# Patient Record
Sex: Female | Born: 1945 | Race: White | Hispanic: No | Marital: Single | Smoking: Current some day smoker
Health system: Southern US, Community
[De-identification: ages and names within clinical notes are randomized; demographics above are authoritative.]

## PROBLEM LIST (undated history)

## (undated) DIAGNOSIS — F32A Depression, unspecified: Secondary | ICD-10-CM

## (undated) DIAGNOSIS — G2581 Restless legs syndrome: Secondary | ICD-10-CM

## (undated) DIAGNOSIS — I10 Essential (primary) hypertension: Secondary | ICD-10-CM

## (undated) DIAGNOSIS — F329 Major depressive disorder, single episode, unspecified: Secondary | ICD-10-CM

## (undated) DIAGNOSIS — K219 Gastro-esophageal reflux disease without esophagitis: Secondary | ICD-10-CM

## (undated) DIAGNOSIS — G20 Parkinson's disease: Secondary | ICD-10-CM

## (undated) DIAGNOSIS — F419 Anxiety disorder, unspecified: Secondary | ICD-10-CM

## (undated) DIAGNOSIS — E039 Hypothyroidism, unspecified: Secondary | ICD-10-CM

## (undated) DIAGNOSIS — IMO0002 Reserved for concepts with insufficient information to code with codable children: Secondary | ICD-10-CM

## (undated) DIAGNOSIS — E119 Type 2 diabetes mellitus without complications: Secondary | ICD-10-CM

## (undated) DIAGNOSIS — R251 Tremor, unspecified: Principal | ICD-10-CM

## (undated) DIAGNOSIS — T8859XA Other complications of anesthesia, initial encounter: Secondary | ICD-10-CM

## (undated) HISTORY — DX: Anxiety disorder, unspecified: F41.9

## (undated) HISTORY — DX: Reserved for concepts with insufficient information to code with codable children: IMO0002

## (undated) HISTORY — PX: CHOLECYSTECTOMY: SHX55

## (undated) HISTORY — PX: KNEE SURGERY: SHX244

## (undated) HISTORY — PX: HAND SURGERY: SHX662

## (undated) HISTORY — PX: BREAST REDUCTION SURGERY: SHX8

## (undated) HISTORY — PX: ABDOMINAL HYSTERECTOMY: SHX81

## (undated) HISTORY — DX: Tremor, unspecified: R25.1

## (undated) HISTORY — DX: Major depressive disorder, single episode, unspecified: F32.9

## (undated) HISTORY — PX: CERVICAL SPINE SURGERY: SHX589

## (undated) HISTORY — DX: Type 2 diabetes mellitus without complications: E11.9

## (undated) HISTORY — DX: Parkinson's disease: G20

## (undated) HISTORY — DX: Depression, unspecified: F32.A

## (undated) HISTORY — DX: Gastro-esophageal reflux disease without esophagitis: K21.9

## (undated) HISTORY — DX: Hypothyroidism, unspecified: E03.9

## (undated) HISTORY — DX: Essential (primary) hypertension: I10

## (undated) HISTORY — DX: Restless legs syndrome: G25.81

---

## 1997-09-19 ENCOUNTER — Inpatient Hospital Stay (HOSPITAL_COMMUNITY): Admission: AD | Admit: 1997-09-19 | Discharge: 1997-09-19 | Payer: Self-pay | Admitting: Gynecology

## 1999-01-05 ENCOUNTER — Other Ambulatory Visit: Admission: RE | Admit: 1999-01-05 | Discharge: 1999-01-05 | Payer: Self-pay | Admitting: Gynecology

## 1999-09-28 ENCOUNTER — Encounter: Payer: Self-pay | Admitting: Orthopedic Surgery

## 1999-09-28 ENCOUNTER — Encounter: Admission: RE | Admit: 1999-09-28 | Discharge: 1999-09-28 | Payer: Self-pay | Admitting: Orthopedic Surgery

## 2000-01-12 ENCOUNTER — Other Ambulatory Visit: Admission: RE | Admit: 2000-01-12 | Discharge: 2000-01-12 | Payer: Self-pay | Admitting: Gynecology

## 2000-06-25 ENCOUNTER — Ambulatory Visit (HOSPITAL_BASED_OUTPATIENT_CLINIC_OR_DEPARTMENT_OTHER): Admission: RE | Admit: 2000-06-25 | Discharge: 2000-06-26 | Payer: Self-pay | Admitting: Specialist

## 2000-06-25 ENCOUNTER — Encounter (INDEPENDENT_AMBULATORY_CARE_PROVIDER_SITE_OTHER): Payer: Self-pay | Admitting: Specialist

## 2001-01-11 ENCOUNTER — Other Ambulatory Visit: Admission: RE | Admit: 2001-01-11 | Discharge: 2001-01-11 | Payer: Self-pay | Admitting: Gynecology

## 2001-05-03 ENCOUNTER — Encounter: Payer: Self-pay | Admitting: Internal Medicine

## 2001-05-03 ENCOUNTER — Encounter: Admission: RE | Admit: 2001-05-03 | Discharge: 2001-05-03 | Payer: Self-pay | Admitting: Internal Medicine

## 2002-01-20 ENCOUNTER — Other Ambulatory Visit: Admission: RE | Admit: 2002-01-20 | Discharge: 2002-01-20 | Payer: Self-pay | Admitting: Gynecology

## 2003-01-22 ENCOUNTER — Other Ambulatory Visit: Admission: RE | Admit: 2003-01-22 | Discharge: 2003-01-22 | Payer: Self-pay | Admitting: Gynecology

## 2003-04-19 ENCOUNTER — Encounter: Payer: Self-pay | Admitting: Orthopedic Surgery

## 2003-04-19 ENCOUNTER — Encounter: Admission: RE | Admit: 2003-04-19 | Discharge: 2003-04-19 | Payer: Self-pay | Admitting: Orthopedic Surgery

## 2004-02-09 ENCOUNTER — Other Ambulatory Visit: Admission: RE | Admit: 2004-02-09 | Discharge: 2004-02-09 | Payer: Self-pay | Admitting: Gynecology

## 2005-03-02 ENCOUNTER — Other Ambulatory Visit: Admission: RE | Admit: 2005-03-02 | Discharge: 2005-03-02 | Payer: Self-pay | Admitting: Gynecology

## 2005-08-15 ENCOUNTER — Ambulatory Visit (HOSPITAL_BASED_OUTPATIENT_CLINIC_OR_DEPARTMENT_OTHER): Admission: RE | Admit: 2005-08-15 | Discharge: 2005-08-15 | Payer: Self-pay | Admitting: Orthopedic Surgery

## 2006-02-14 ENCOUNTER — Encounter: Admission: RE | Admit: 2006-02-14 | Discharge: 2006-02-14 | Payer: Self-pay | Admitting: Rheumatology

## 2006-04-19 ENCOUNTER — Encounter: Admission: RE | Admit: 2006-04-19 | Discharge: 2006-04-19 | Payer: Self-pay | Admitting: Orthopaedic Surgery

## 2006-05-29 ENCOUNTER — Encounter: Admission: RE | Admit: 2006-05-29 | Discharge: 2006-05-29 | Payer: Self-pay | Admitting: Orthopaedic Surgery

## 2006-06-12 ENCOUNTER — Other Ambulatory Visit: Admission: RE | Admit: 2006-06-12 | Discharge: 2006-06-12 | Payer: Self-pay | Admitting: Gynecology

## 2006-07-19 ENCOUNTER — Inpatient Hospital Stay (HOSPITAL_COMMUNITY): Admission: RE | Admit: 2006-07-19 | Discharge: 2006-07-23 | Payer: Self-pay | Admitting: Orthopaedic Surgery

## 2006-11-06 ENCOUNTER — Encounter: Admission: RE | Admit: 2006-11-06 | Discharge: 2006-11-06 | Payer: Self-pay | Admitting: Orthopedic Surgery

## 2006-11-06 ENCOUNTER — Encounter: Admission: RE | Admit: 2006-11-06 | Discharge: 2006-11-06 | Payer: Self-pay | Admitting: Orthopaedic Surgery

## 2007-02-06 ENCOUNTER — Inpatient Hospital Stay (HOSPITAL_COMMUNITY): Admission: RE | Admit: 2007-02-06 | Discharge: 2007-02-07 | Payer: Self-pay | Admitting: Orthopaedic Surgery

## 2007-05-02 ENCOUNTER — Encounter: Admission: RE | Admit: 2007-05-02 | Discharge: 2007-05-02 | Payer: Self-pay | Admitting: Orthopedic Surgery

## 2007-08-24 ENCOUNTER — Encounter: Admission: RE | Admit: 2007-08-24 | Discharge: 2007-08-24 | Payer: Self-pay | Admitting: Orthopaedic Surgery

## 2007-10-21 ENCOUNTER — Other Ambulatory Visit: Admission: RE | Admit: 2007-10-21 | Discharge: 2007-10-21 | Payer: Self-pay | Admitting: Gynecology

## 2008-01-01 ENCOUNTER — Encounter: Payer: Self-pay | Admitting: Internal Medicine

## 2008-02-11 ENCOUNTER — Ambulatory Visit: Payer: Self-pay | Admitting: Internal Medicine

## 2008-02-11 DIAGNOSIS — R05 Cough: Secondary | ICD-10-CM

## 2008-02-11 DIAGNOSIS — R059 Cough, unspecified: Secondary | ICD-10-CM

## 2008-02-11 DIAGNOSIS — K219 Gastro-esophageal reflux disease without esophagitis: Secondary | ICD-10-CM

## 2008-02-11 HISTORY — DX: Cough, unspecified: R05.9

## 2008-02-11 HISTORY — DX: Gastro-esophageal reflux disease without esophagitis: K21.9

## 2008-02-16 DIAGNOSIS — E119 Type 2 diabetes mellitus without complications: Secondary | ICD-10-CM

## 2008-02-16 HISTORY — DX: Type 2 diabetes mellitus without complications: E11.9

## 2008-05-18 ENCOUNTER — Telehealth (INDEPENDENT_AMBULATORY_CARE_PROVIDER_SITE_OTHER): Payer: Self-pay | Admitting: *Deleted

## 2008-05-19 ENCOUNTER — Ambulatory Visit: Payer: Self-pay | Admitting: Internal Medicine

## 2008-05-22 ENCOUNTER — Telehealth (INDEPENDENT_AMBULATORY_CARE_PROVIDER_SITE_OTHER): Payer: Self-pay | Admitting: *Deleted

## 2008-05-25 ENCOUNTER — Telehealth (INDEPENDENT_AMBULATORY_CARE_PROVIDER_SITE_OTHER): Payer: Self-pay | Admitting: *Deleted

## 2008-09-20 ENCOUNTER — Encounter: Admission: RE | Admit: 2008-09-20 | Discharge: 2008-09-20 | Payer: Self-pay | Admitting: Orthopaedic Surgery

## 2008-10-12 ENCOUNTER — Ambulatory Visit: Payer: Self-pay | Admitting: Internal Medicine

## 2008-10-12 DIAGNOSIS — R042 Hemoptysis: Secondary | ICD-10-CM

## 2008-10-12 DIAGNOSIS — M5136 Other intervertebral disc degeneration, lumbar region: Secondary | ICD-10-CM

## 2008-10-12 DIAGNOSIS — M51369 Other intervertebral disc degeneration, lumbar region without mention of lumbar back pain or lower extremity pain: Secondary | ICD-10-CM

## 2008-10-12 DIAGNOSIS — IMO0002 Reserved for concepts with insufficient information to code with codable children: Secondary | ICD-10-CM

## 2008-10-12 HISTORY — DX: Reserved for concepts with insufficient information to code with codable children: IMO0002

## 2008-10-12 HISTORY — DX: Other intervertebral disc degeneration, lumbar region without mention of lumbar back pain or lower extremity pain: M51.369

## 2008-10-12 HISTORY — DX: Hemoptysis: R04.2

## 2008-10-12 HISTORY — DX: Other intervertebral disc degeneration, lumbar region: M51.36

## 2008-10-13 ENCOUNTER — Telehealth: Payer: Self-pay | Admitting: Internal Medicine

## 2008-10-16 ENCOUNTER — Telehealth (INDEPENDENT_AMBULATORY_CARE_PROVIDER_SITE_OTHER): Payer: Self-pay | Admitting: *Deleted

## 2010-02-03 IMAGING — MG MAMMOGRAM DIAGNOSTIC UNILATERAL DIGITAL W/ CAD
2 series · 2 of 2 positions shown · non-contrast
Comparison: none

FINDINGS
CLINICAL DATA: LUMBAR HNP.
TWO PORTABLE CROSS TABLE LATERAL VIEWS OF THE LUMBAR SPINE WERE OBTAINED IN THE OPERATING ROOM.
FILM #1 SHOWS A INSTRUMENT POSTERIORLY IN THE L3-4 INTERSPINOUS SPACE.  FILM #2 SHOWS POSTERIOR
RETRACTORS AS WELL AS AN INSTRUMENT OVERLYING THE POSTERIOR ELEMENTS TO THE LEVEL OF L4-5.
IMPRESSION

[L (1 of 2)]
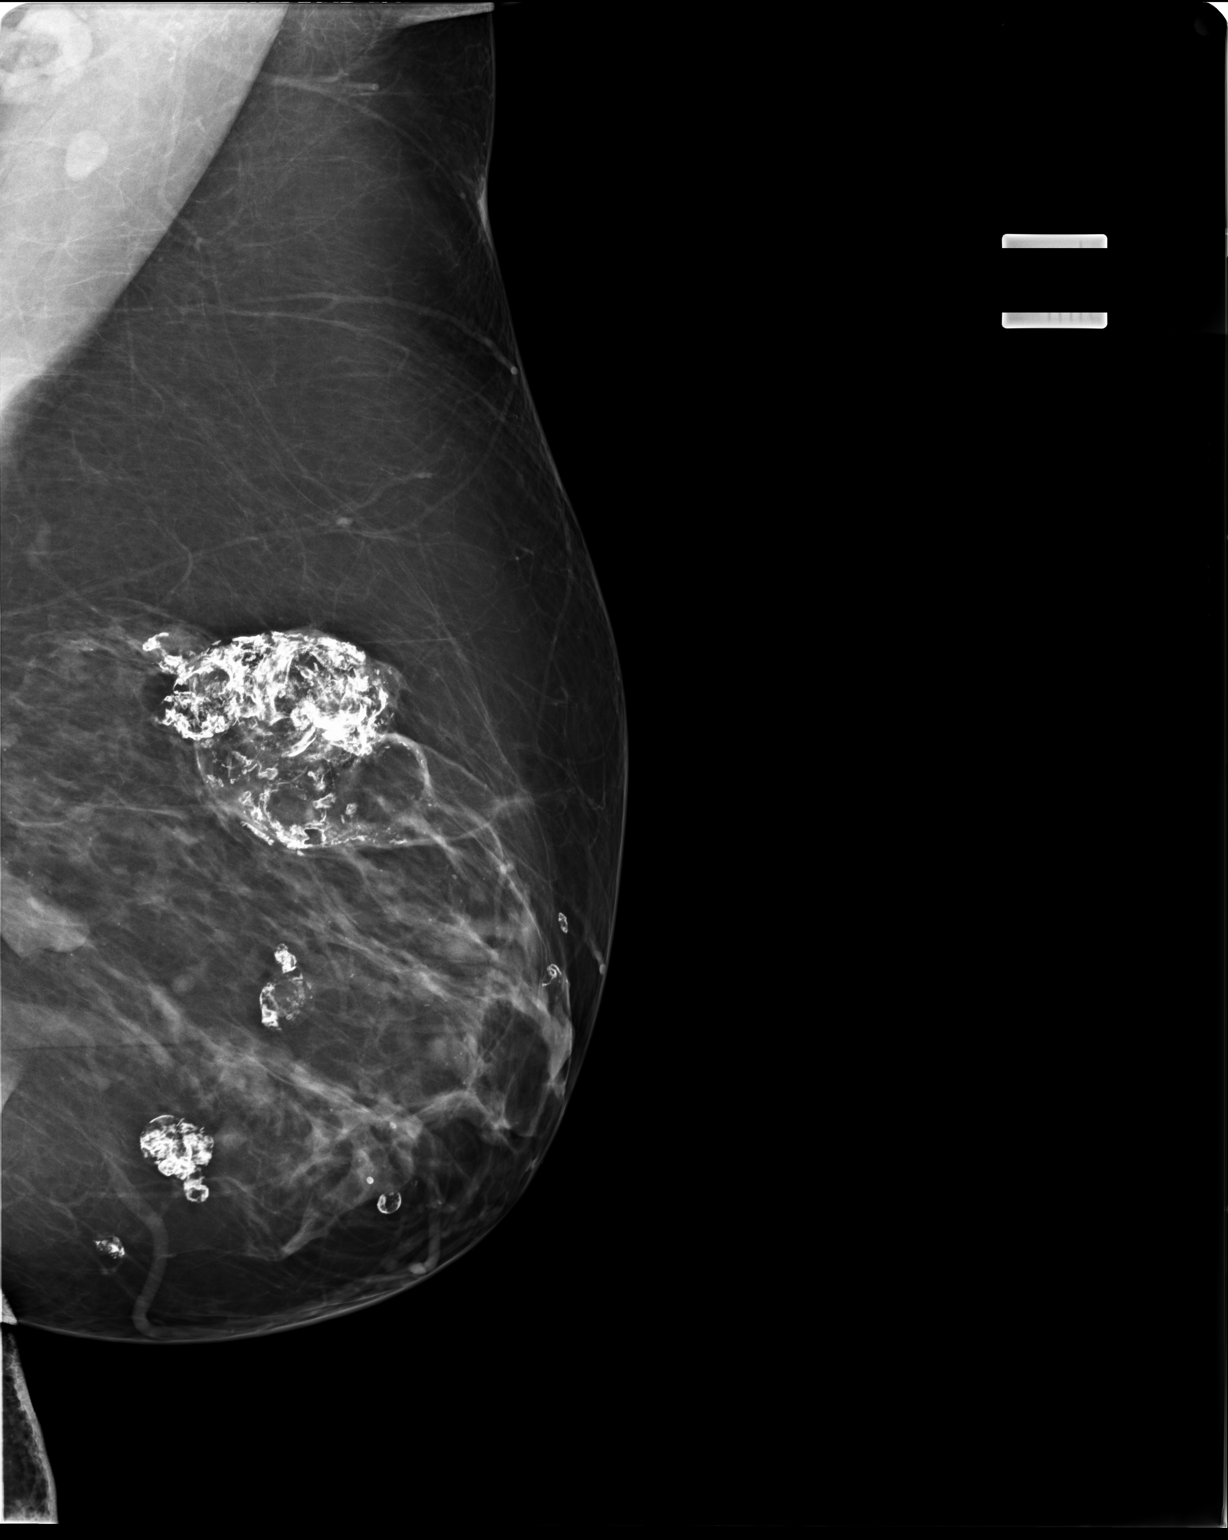

[L (2 of 2)]
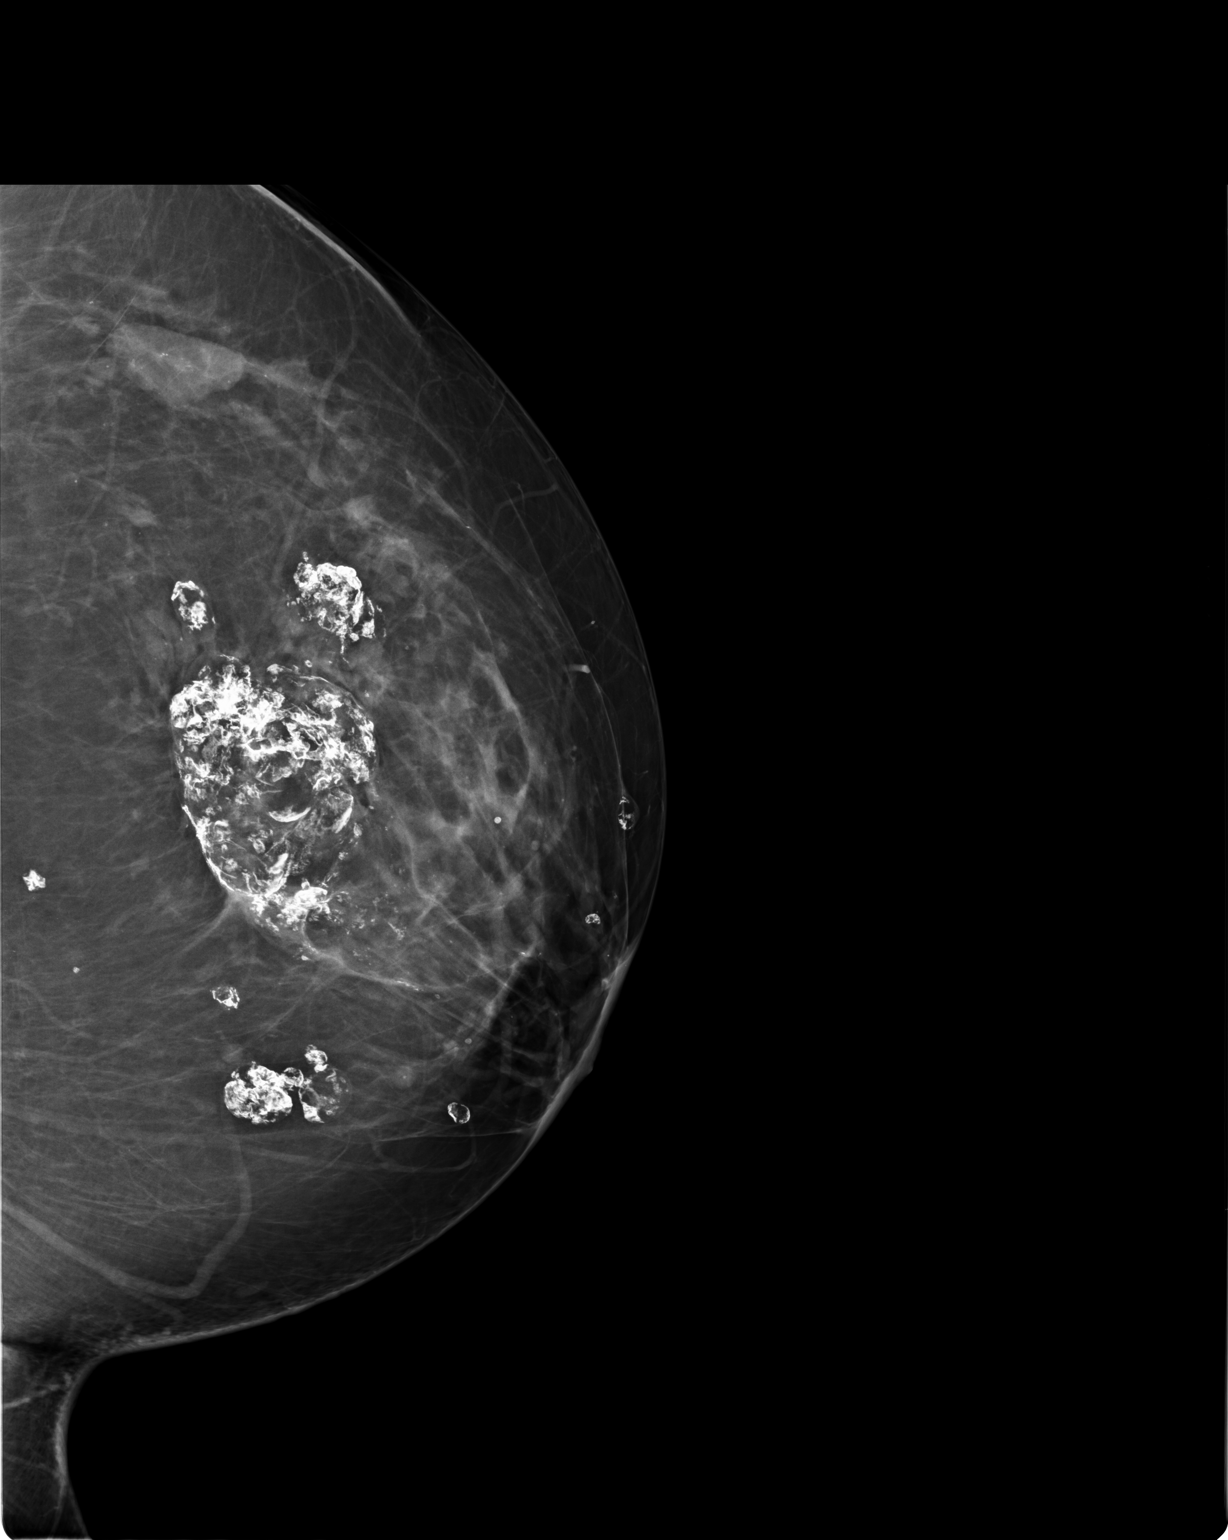

[2 of 2 positions shown; findings below may reference images not displayed]

## 2010-08-22 ENCOUNTER — Encounter: Payer: Self-pay | Admitting: Orthopedic Surgery

## 2010-08-30 NOTE — Progress Notes (Signed)
Summary: results  Phone Note Call from Patient Call back at Home Phone [phone]   Caller: Patient Call For: tammy p Reason for Call: Talk to Nurse, Lab or Test Results Summary of Call: cxr results Initial call taken by: Eugene Gavia,  May 25, 2008 3:36 PM  Follow-up for Phone Call        Pt requesting CXR results from 05-19-08. Please advise. Michel Bickers [facility]  May 25, 2008 4:03 PM  Additional Follow-up for Phone Call Additional follow up Details #1::        cxr ok no changes.  Additional Follow-up by: Rubye Oaks NP,  May 25, 2008 4:35 PM    Additional Follow-up for Phone Call Additional follow up Details #2::    Pt aware of CXR results Follow-up by: Michel Bickers CMA,  May 25, 2008 5:00 PM

## 2010-08-30 NOTE — Assessment & Plan Note (Signed)
Summary: cough/lmr   Referred by:  Shary Decamp PCP:  Shary Decamp  Chief Complaint:  prod ocugh with sticky white sputum, esp when lying down - has been taking protonix bid like CY rec, and but sees no improvement.  History of Present Illness:  65 year old woman with known history of chronic cough since 1990.  02/11/08--Seen in pulmonary consultation at the kind request of Alona Bene, nurse practitioner at Dr. Anders Grant office. complaints of coughing for 15 to 20 years.  Sometimes, made worse by lying down or "everything sometimes.".  This cough will wake her up.  It is not seasonal or whether related.  She has been told she has postnasal drip, but she is not aware of it and notices only mild seasonal rhinitis.  Stress incontinence.  Previous diagnosis of laryngopharyngial  reflux and cyclical cough. In 1990 Dr. Charm Barges diagnosed GERD and peptic ulcer disease, and put her on acid blockers.  Using protonixit b.i.d., old record indicates she got 80 to 90% better. she dropped back to once a day.  Does wake ware of reflux into her throat such that she has to get up. Tussionex has helped some.  She could not tolerate a 24-hour pH probe.  Asthma treatment with metered inhaler did not help. little hoarseness.  Has had pneumonia and occasional sinusitis, but cough is not usually productive.--advised to use PPI two times a day .   May 19, 2008--Presents for flare of cough for last few weeks, productive with thick white mucus, severe fits at times. Review of meds shows she is on ace inhibitor for unkonwn amount of time, not on at last visit. Pt unclear to start date. Denies chest pain, dyspnea, orthopnea, hemoptysis, fever, n/v/d, edema. Has been on antibiotics x 3 without relief.            Prior Medications Reviewed Using: Medication Bottles  Updated Prior Medication List: LEVOTHROID 112 MCG  TABS (LEVOTHYROXINE SODIUM) take 1 by mouth once daily CYMBALTA 60 MG  CPEP (DULOXETINE HCL) take 1 by mouth once  daily CYMBALTA 30 MG  CPEP (DULOXETINE HCL) take 1 by mouth once daily FUROSEMIDE 40 MG  TABS (FUROSEMIDE) take 1 by mouth once daily PANTOPRAZOLE SODIUM 40 MG  TBEC (PANTOPRAZOLE SODIUM) 1 twice daily before meals NITROSTAT 0.4 MG  SUBL (NITROGLYCERIN) as needed GLIPIZIDE XL 2.5 MG  XR24H-TAB (GLIPIZIDE) take 3 by mouth once daily in divided doses METFORMIN HCL 500 MG  TABS (METFORMIN HCL) take 1 by mouth once daily with food RAMIPRIL 2.5 MG CAPS (RAMIPRIL) Take 1 capsule by mouth once a day MELOXICAM 15 MG TABS (MELOXICAM) Take 1 tablet by mouth once a day MIRAPEX 0.5 MG TABS (PRAMIPEXOLE DIHYDROCHLORIDE) up to 3 tabs by mouth daily  Current Allergies (reviewed today): ! PCN ! * TAPE ! MORPHINE ! * LATEX  Past Medical History:    Reviewed history from 02/11/2008 and no changes required:       G E R D (ICD-530.81)       DIABETES, TYPE 2 (ICD-250.00)       COUGH (ICD-786.2)       GASTROESOPHAGEAL REFLUX DISEASE, CHRONIC (ICD-530.81)           Family History:    Reviewed history from 02/11/2008 and no changes required:       Family History Coronary Heart Disease- brother died       mother died age 17  Social History:    Reviewed history from 02/11/2008 and no changes required:  Patient is a current smoker.- 1/2 ppd        disability       separated, living alone with 2 dogs and 2 cats    Risk Factors: Tobacco use:  current   Review of Systems      See HPI   Vital Signs:  Patient Profile:   65 Years Old Female Weight:      187.38 pounds O2 Sat:      98 % O2 treatment:    Room Air Temp:     96.8 degrees F oral Pulse rate:   73 / minute BP sitting:   130 / 74  (left arm) Cuff size:   regular  Vitals Entered By: Boone Master CNA (May 19, 2008 3:10 PM)             Is Patient Diabetic? Yes Comments Medications reviewed with patient Boone Master CNA  May 19, 2008 3:13 PM      Physical Exam  GEN: A/Ox3; pleasant , NAD HEENT:  [place]/AT, ,  EACs-clear, TMs-wnl, NOSE-clear, THROAT-clear NECK:  Supple w/ fair ROM; no JVD; normal carotid impulses w/o bruits; no thyromegaly or nodules palpated; no lymphadenopathy. CHEST:  Coarse BS with psuedowheezing on forced exp.  HEART:  RRR, no m/r/g   ABDOMEN:  Soft & nt; nml bowel sounds; no organomegaly or masses detected. EXT: Warm bil,  no calf pain, edema, clubbing, pulses intact Neuro: EOM-wnl, PERRLA, CN 2-12 intact,nml equal grips/streng       Impression & Recommendations:  Problem # 1:  COUGH (ICD-786.2) Exacerbation of chronic cough, now on ace inhibitor which I suspect is causing flare and preventing improvement.  CXR pending.  REC: Stop Ramipril.  Begin Micardis 20mg  once daily  Delsym 2 tsp every 12 hrs as needed cough Tessalon Perles three times a day as needed still coughing.  Phenergan VC codeine 1 tsp every 4 hr as needed still coughing.  Protonix 40mg  two times a day  Sugarless candy to avoid throat clearing and coughing.  Stop smoking.  Please contact office for sooner follow up if symptoms do not improve or worsen  follow up 2-3 weeks Dr. Maple Hudson  Orders: T-2 View CXR, Same Day (71020.5TC) Est. Patient Level IV (07371)   Medications Added to Medication List This Visit: 1)  Ramipril 2.5 Mg Caps (Ramipril) .... Take 1 capsule by mouth once a day 2)  Micardis 20 Mg Tabs (Telmisartan) .Marland Kitchen.. 1 by mouth once daily 3)  Meloxicam 15 Mg Tabs (Meloxicam) .... Take 1 tablet by mouth once a day 4)  Mirapex 0.5 Mg Tabs (Pramipexole dihydrochloride) .... Up to 3 tabs by mouth daily 5)  Promethazine-codeine 6.25-10 Mg/60ml Syrp (Promethazine-codeine) .Marland Kitchen.. 1 tsp every 4-6 hrs as needed coughing.  Complete Medication List: 1)  Levothroid 112 Mcg Tabs (Levothyroxine sodium) .... Take 1 by mouth once daily 2)  Cymbalta 60 Mg Cpep (Duloxetine hcl) .... Take 1 by mouth once daily 3)  Cymbalta 30 Mg Cpep (Duloxetine hcl) .... Take 1 by mouth once daily 4)  Furosemide 40 Mg Tabs  (Furosemide) .... Take 1 by mouth once daily 5)  Pantoprazole Sodium 40 Mg Tbec (Pantoprazole sodium) .Marland Kitchen.. 1 twice daily before meals 6)  Nitrostat 0.4 Mg Subl (Nitroglycerin) .... As needed 7)  Glipizide Xl 2.5 Mg Xr24h-tab (Glipizide) .... Take 3 by mouth once daily in divided doses 8)  Metformin Hcl 500 Mg Tabs (Metformin hcl) .... Take 1 by mouth once daily with food 9)  Micardis 20  Mg Tabs (Telmisartan) .Marland Kitchen.. 1 by mouth once daily 10)  Meloxicam 15 Mg Tabs (Meloxicam) .... Take 1 tablet by mouth once a day 11)  Mirapex 0.5 Mg Tabs (Pramipexole dihydrochloride) .... Up to 3 tabs by mouth daily 12)  Promethazine-codeine 6.25-10 Mg/58ml Syrp (Promethazine-codeine) .Marland Kitchen.. 1 tsp every 4-6 hrs as needed coughing.   Patient Instructions: 1)  Stop Ramipril.  2)  Begin Micardis 20mg  once daily  3)  Delsym 2 tsp every 12 hrs as needed cough 4)  Tessalon Perles three times a day as needed still coughing.  5)  Phenergan VC codeine 1 tsp every 4 hr as needed still coughing.  6)  Protonix 40mg  two times a day  7)  Sugarless candy to avoid throat clearing and coughing.  8)  Stop smoking.  9)  Please contact office for sooner follow up if symptoms do not improve or worsen  10)  follow up 2-3 weeks Dr. Maple Hudson    Prescriptions: MICARDIS 20 MG TABS (TELMISARTAN) 1 by mouth once daily  #30 x 0   Entered and Authorized by:   Rubye Oaks NP   Signed by:   Rubye Oaks NP on 05/19/2008   Method used:   Electronically to        [facility] (retail)       [address]       [place], [place]  [address]       Ph: [phone] or [phone]       Fax: [phone]   RxID:   [id number] PROMETHAZINE-CODEINE 6.25-10 MG/5ML SYRP (PROMETHAZINE-CODEINE) 1 tsp every 4-6 hrs as needed coughing.  #80z x 0   Entered and Authorized by:   Rubye Oaks NP   Signed by:   Jahleah Mariscal NP on 05/19/2008   Method used:   Print then Give to Patient   RxID:    [id number]  ]

## 2010-08-30 NOTE — Progress Notes (Signed)
Summary: results  Phone Note Call from Patient   Caller: Patient Call For: Nirav Sweda Summary of Call: cxr results Initial call taken by: Tivis Ringer,  October 13, 2008 2:09 PM  Follow-up for Phone Call        Please advise cxr results.  Thanks! Vernie Murders  October 13, 2008 2:18 PM   Additional Follow-up for Phone Call Additional follow up Details #1::        LMTCB; results are pending in my box. Reynaldo Minium CMA  October 14, 2008 8:33 AM     Additional Follow-up for Phone Call Additional follow up Details #2::    Pt aware of results. Reynaldo Minium CMA  October 14, 2008 8:59 AM

## 2010-08-30 NOTE — Assessment & Plan Note (Signed)
Summary: coughing up blood ///kp   Copy to:  Shary Decamp Primary Provider/Referring Provider:  Shary Decamp  CC:  Acute visit pt c/o prod cough this am small amt of bright red blood pt is a current smoker.  History of Present Illness: Current Problems:  G E R D (ICD-530.81) DIABETES, TYPE 2 (ICD-250.00) COUGH (ICD-786.2) GASTROESOPHAGEAL REFLUX DISEASE, CHRONIC (ICD-66.26)   65 year old woman with known history of chronic cough since 1990. 02/11/08--Seen in pulmonary consultation at the kind request of Alona Bene, nurse practitioner at Dr. Anders Grant office. complaints of coughing for 15 to 20 years.  Sometimes, made worse by lying down or "everything sometimes.".  This cough will wake her up.  It is not seasonal or whether related.  She has been told she has postnasal drip, but she is not aware of it and notices only mild seasonal rhinitis.  Stress incontinence.  Previous diagnosis of laryngopharyngial  reflux and cyclical cough. In 1990 Dr. Charm Barges cdiagnosed GERD and peptic ulcer disease, and put her on acid blockers.  Using protonixit b.i.d., old record indicates she got 80 to 90% better. she dropped back to once a day.  Does wake ware of reflux into her throat such that she has to get up. Tussionex has helped some.  She could not tolerate a 24-hour pH probe.  Asthma treatment with metered inhaler did not help. little hoarseness.  Has had pneumonia and occasional sinusitis, but cough is not usually productive.--advised to use PPI two times a day .   May 19, 2008--Presents for flare of cough for last few weeks, productive with thick white mucus, severe fits at times. Review of meds shows she is on ace inhibitor for unkonwn amount of time, not on at last visit. Pt unclear to start date. Denies chest pain, dyspnea, orthopnea, hemoptysis, fever, n/v/d, edema. Has been on antibiotics x 3 without relief.   10/12/08- Chronic cough, GERD Daily chronic cough, sometimes harder than others. Less cough since Dr Noel Gerold  gave Tramadol for hip pain. Not on aspirin products. Denies other bleeding, wheezre, dyspnea or recent cold, fever or infection. Still smoking about 1/2 ppd. Just today for first time noted small streak of blood in white sputum. chest feels no different from ususal.    Problems Prior to Update: 1)  Hemoptysis  (ICD-786.3) 2)  G E R D  (ICD-530.81) 3)  Diabetes, Type 2  (ICD-250.00) 4)  Cough  (ICD-786.2) 5)  Gastroesophageal Reflux Disease, Chronic  (ICD-530.81) 6)  Degenerative Disc Disease  (ICD-722.6)  Current Medications (verified): 1)  Levothroid 112 Mcg  Tabs (Levothyroxine Sodium) .... Take 1 By Mouth Once Daily 2)  Cymbalta 60 Mg  Cpep (Duloxetine Hcl) .... Take 1 By Mouth Once Daily 3)  Cymbalta 30 Mg  Cpep (Duloxetine Hcl) .... Take 1 By Mouth Once Daily 4)  Furosemide 40 Mg  Tabs (Furosemide) .... Take 1 By Mouth Once Daily 5)  Pantoprazole Sodium 40 Mg  Tbec (Pantoprazole Sodium) .Marland Kitchen.. 1 Twice Daily Before Meals 6)  Nitrostat 0.4 Mg  Subl (Nitroglycerin) .... As Needed 7)  Glipizide Xl 2.5 Mg  Xr24h-Tab (Glipizide) .... Take 3 By Mouth Once Daily in Divided Doses 8)  Metformin Hcl 500 Mg  Tabs (Metformin Hcl) .... Take 1 By Mouth Once Daily With Food 9)  Micardis 20 Mg Tabs (Telmisartan) .Marland Kitchen.. 1 By Mouth Once Daily 10)  Meloxicam 15 Mg Tabs (Meloxicam) .... Take 1 Tablet By Mouth Once A Day 11)  Mirapex 0.5 Mg Tabs (Pramipexole Dihydrochloride) .... Up To  3 Tabs By Mouth Daily 12)  Tramadol Hcl 50 Mg Tabs (Tramadol Hcl) .Marland Kitchen.. 1 Every 8 Hrs As Needed  Allergies: 1)  ! Pcn 2)  ! * Tape 3)  ! Morphine 4)  ! * Latex  Past History  Past Medical History: G E R D (ICD-530.81) DIABETES, TYPE 2 (ICD-250.00) COUGH (ICD-786.2) GASTROESOPHAGEAL REFLUX DISEASE, CHRONIC (ICD-530.81) Degenerative disk disease   (10/12/2008)  Past Surgical History: Cervical and lumbar disk surgery- DGD Septoplasty Breast reduction (03-04-2008)  Family History: Family History Coronary Heart  Disease- brother died mother died age 39  (March 04, 2008)  Social History: Patient is a current smoker.- 1/2 ppd  disability separated, living alone with 2 dogs and 2 cats   (03-04-2008)  Risk Factors: Alcohol Use: N/A >5 drinks/d w/in last 3 months: N/A Caffeine Use: N/A Diet: N/A Exercise: N/A  Risk Factors: Smoking Status: current (2008/03/04) Packs/Day: N/A Cigars/wk: N/A Pipe Use/wk: N/A Cans of tobacco/wk: N/A Passive Smoke Exposure: N/A  Past Medical History:    Reviewed history from 05/19/2008 and no changes required:       G E R D (ICD-530.81)       DIABETES, TYPE 2 (ICD-250.00)       COUGH (ICD-786.2)       GASTROESOPHAGEAL REFLUX DISEASE, CHRONIC (ICD-530.81)       Degenerative disk disease          Family History:    Reviewed history from 03/04/08 and no changes required:       Family History Coronary Heart Disease- brother died       mother died age 71  Social History:    Reviewed history from 04-Mar-2008 and no changes required:       Patient is a current smoker.- 1/2 ppd        disability       separated, living alone with 2 dogs and 2 cats   Review of Systems      See HPI       Denies headache, sinus drainage, sneezing chest pain, dyspnea, n/v/d, weight loss, fever, edema.    Vital Signs:  Patient profile:   65 year old female Height:      63 inches Weight:      185 pounds BMI:     32.89 O2 Sat:      96 % Pulse rate:   63 / minute BP sitting:   140 / 78  (left arm)  Vitals Entered By: Renold Genta RCP, LPN (October 12, 2008 9:09 AM)  O2 Sat on room air at rest %:  96  Physical Exam  Additional Exam:  General: A/Ox3; pleasant and cooperative, NAD, overweight SKIN: no rash, lesions NODES: no lymphadenopathy HEENT: [place]/AT, EOM- WNL, Conjuctivae- clear, PERRLA, TM-WNL, Nose- clear, nasal stud piercing, Throat- clear and wnl NECK: Supple w/ fair ROM, JVD- none, normal carotid impulses w/o bruits Thyroid- normal to palpation CHEST: Clear  to P&A, no cough or wheeze, no rhonchi HEART: RRR, no m/g/r heard  ZOX:WRUE, nl pulses, no edema  NEURO: Grossly intact to observatio      Pulmonary Function Test Height (in.): 63 Gender: Female  Impression & Recommendations:  Problem # 1:  HEMOPTYSIS (ICD-786.3) Probable henmorrhagic bronchitis. We need to get cxr in this smoker. She is encouraged to exercise as a stress relief instead of smoking.  Problem # 2:  COUGH (ICD-786.2) Chronic bronchitis and probably multifactorial cough. Smoking cessation emphasized.  Complete Medication List: 1)  Levothroid 112 Mcg Tabs (Levothyroxine sodium) .Marland KitchenMarland KitchenMarland Kitchen  Take 1 by mouth once daily 2)  Cymbalta 60 Mg Cpep (Duloxetine hcl) .... Take 1 by mouth once daily 3)  Cymbalta 30 Mg Cpep (Duloxetine hcl) .... Take 1 by mouth once daily 4)  Furosemide 40 Mg Tabs (Furosemide) .... Take 1 by mouth once daily 5)  Pantoprazole Sodium 40 Mg Tbec (Pantoprazole sodium) .Marland Kitchen.. 1 twice daily before meals 6)  Nitrostat 0.4 Mg Subl (Nitroglycerin) .... As needed 7)  Glipizide Xl 2.5 Mg Xr24h-tab (Glipizide) .... Take 3 by mouth once daily in divided doses 8)  Metformin Hcl 500 Mg Tabs (Metformin hcl) .... Take 1 by mouth once daily with food 9)  Micardis 20 Mg Tabs (Telmisartan) .Marland Kitchen.. 1 by mouth once daily 10)  Meloxicam 15 Mg Tabs (Meloxicam) .... Take 1 tablet by mouth once a day 11)  Mirapex 0.5 Mg Tabs (Pramipexole dihydrochloride) .... Up to 3 tabs by mouth daily 12)  Tramadol Hcl 50 Mg Tabs (Tramadol hcl) .Marland Kitchen.. 1 every 8 hrs as needed 13)  Tessalon Perles 100 Mg Caps (Benzonatate) .Marland Kitchen.. 1 four times a day as needed  Other Orders: Est. Patient Level III (16109) T-2 View CXR, Same Day (71020.5TC)  Patient Instructions: 1)  Please schedule a follow-up appointment in 1 month. 2)  A chest x-ray has been recommended.  Your imaging study may require preauthorization.  3)  I think it is time for you to stop smoking, and I like the suggestion that you exercise  regularly.

## 2010-08-30 NOTE — Progress Notes (Signed)
Summary: PRESCRIPT  Phone Note Other Incoming Call back at [phone]   Caller: Patient Call placed by: [facility]  Call placed to: PARRETT Summary of Call: NEED PRESCRIPT FOR TUSSALON PEARLS CALLLED TO PHARMACY Initial call taken by: Rickard Patience,  May 22, 2008 11:41 AM  Follow-up for Phone Call        Saw TP on 05/19/08 pt instruct states pt to take tessalon pearls  three times a day as needed.  Will send rx electronically to pharmacy. Sent rx electronically to pharmacy.   Follow-up by: Cloyde Reams RN,  May 22, 2008 11:55 AM    New/Updated Medications: TESSALON PERLES 100 MG CAPS (BENZONATATE) Take one taklet three times a day as needed   Prescriptions: TESSALON PERLES 100 MG CAPS (BENZONATATE) Take one taklet three times a day as needed  #60 x 0   Entered by:   Cloyde Reams RN   Authorized by:   Rubye Oaks NP   Signed by:   Cloyde Reams RN on 05/22/2008   Method used:   Electronically to        [facility] (retail)       [address]       [place], [place]  [address]       Ph: [phone] or [phone]       Fax: [phone]   RxID:   [id number]

## 2010-08-30 NOTE — Assessment & Plan Note (Signed)
Summary: COUGHING/ MBW   Visit Type:  Initial Consult Referred by:  Shary Decamp PCP:  Shary Decamp  Chief Complaint:  pulmonary consult.  History of Present Illness: Current Problems:  COUGH (ICD-786.2) GASTROESOPHAGEAL REFLUX DISEASE, CHRONIC (ICD-26.56)  65 year old woman seen in pulmonary consultation at the kind request of Alona Bene, nurse practitioner at Dr. Anders Grant office. complaints of coughing for 15 to 20 years.  Sometimes, made worse by lying down or "everything sometimes.".  This cough will wake her up.  It is not seasonal or whether related.  She has been told she has postnasal drip, but she is not aware of it and notices only mild seasonal rhinitis.  Stress incontinence.  Previous diagnosis of laryngopharyngial  reflux and cyclical cough. In 1990 Dr. Charm Barges diagnosed GERD and peptic ulcer disease, and put her on acid blockers.  Using protonixit b.i.d., old record indicates she got 80 to 90% better. she dropped back to once a day.  Does wake ware of reflux into her throat such that she has to get up. Tussionex has helped some.  She could not tolerate a 24-hour pH probe.  Asthma treatment with metered inhaler did not help. little hoarseness.  Has had pneumonia and occasional sinusitis, but cough is not usually productive.           Prior Medications Reviewed Using: Medication Bottles  Updated Prior Medication List: LEVOTHROID 112 MCG  TABS (LEVOTHYROXINE SODIUM) take 1 by mouth once daily CYMBALTA 60 MG  CPEP (DULOXETINE HCL) take 1 by mouth once daily CYMBALTA 30 MG  CPEP (DULOXETINE HCL) take 1 by mouth once daily FUROSEMIDE 40 MG  TABS (FUROSEMIDE) take 1 by mouth once daily PANTOPRAZOLE SODIUM 40 MG  TBEC (PANTOPRAZOLE SODIUM) take 1 by mouth once daily TOPIRAMATE 25 MG  TABS (TOPIRAMATE) take as directed NITROSTAT 0.4 MG  SUBL (NITROGLYCERIN) as needed GLIPIZIDE XL 2.5 MG  XR24H-TAB (GLIPIZIDE) take 3 by mouth once daily in divided doses METFORMIN HCL 500 MG  TABS (METFORMIN  HCL) take 1 by mouth once daily with food  Current Allergies (reviewed today): ! PCN ! * TAPE ! * LATEX  Past Medical History:    Reviewed history and no changes required:       Diabetes, Type 2       G E R D       Sinusitis       latex dermatitis       restless leg  Past Surgical History:    Reviewed history and no changes required:       Cervical and lumbar disk surgery- DGD       Septoplasty       Breast reduction   Family History:    Reviewed history and no changes required:       Family History Coronary Heart Disease- brother died       mother died age 26  Social History:    Reviewed history and no changes required:       Patient is a current smoker.- 1/2 ppd        disability       separated, living alone with 2 dogs and 2 cats    Risk Factors:  Tobacco use:  current   Review of Systems      See HPI        dyspnea on exertion, cough, sometimes productive, joint stiffness, and feet, swelling.  Denies fever, chills, adenopathy, bloody or purulent discharge, chest pain or palpitation, change in GI or GU,  calf pain.   Vital Signs:  Patient Profile:   65 Years Old Female Weight:      189 pounds O2 Sat:      98 % O2 treatment:    Room Air Pulse rate:   82 / minute BP sitting:   124 / 78  (left arm) Cuff size:   regular  Vitals Entered By: Reynaldo Minium CMA (February 11, 2008 4:00 PM)                 Physical Exam  GENERAL:  A/Ox3; pleasant & cooperative.NAD, obese HEENT:  [place]/AT, EOM-wnl, PERRLA, EACs-clear, TMs-wnl, NOSE-mucus bridging without erosion or visible polyps, THROAT-reddened and glandular NECK:  Supple w/ fair ROM; no JVD; normal carotid impulses w/o bruits; no thyromegaly or nodules palpated; no lymphadenopathy. CHEST: Clear except crackle left mid back HEART:  RRR, no m/r/g  heard ABDOMEN:  Soft & nt; nml bowel sounds; no organomegaly or masses detected. EXT: Warm bilat,  no calf pain, edema, clubbing, pulses intact Skin: no rash/lesion         Impression & Recommendations:  Problem # 1:  COUGH (ICD-786.2) some history of rhinitis, mainly a mild seasonal rhinitis.  Strong documentation of significant reflux associated with this cough, responsive to aggressive use of acid blockers, and reflux precautions in the past.  It is not clear why she dropped back from b.i.d. Protonix,. and she is willing to resume that dose. this therapeutic trial may be diagnostic.  If significant cough persists, we can look at additional possible causes.  Problem # 2:  GASTROESOPHAGEAL REFLUX DISEASE, CHRONIC (ICD-530.81) extra time was spent reviewing reflux precautions with her. Her updated medication list for this problem includes:    Pantoprazole Sodium 40 Mg Tbec (Pantoprazole sodium) .Marland Kitchen... 1 twice daily before meals   Medications Added to Medication List This Visit: 1)  Levothroid 112 Mcg Tabs (Levothyroxine sodium) .... Take 1 by mouth once daily 2)  Cymbalta 60 Mg Cpep (Duloxetine hcl) .... Take 1 by mouth once daily 3)  Cymbalta 30 Mg Cpep (Duloxetine hcl) .... Take 1 by mouth once daily 4)  Furosemide 40 Mg Tabs (Furosemide) .... Take 1 by mouth once daily 5)  Pantoprazole Sodium 40 Mg Tbec (Pantoprazole sodium) .... Take 1 by mouth once daily 6)  Pantoprazole Sodium 40 Mg Tbec (Pantoprazole sodium) .Marland Kitchen.. 1 twice daily before meals 7)  Topiramate 25 Mg Tabs (Topiramate) .... Take as directed 8)  Nitrostat 0.4 Mg Subl (Nitroglycerin) .... As needed 9)  Glipizide Xl 2.5 Mg Xr24h-tab (Glipizide) .... Take 3 by mouth once daily in divided doses 10)  Metformin Hcl 500 Mg Tabs (Metformin hcl) .... Take 1 by mouth once daily with food   Patient Instructions: 1)  Please schedule a follow-up appointment in 1 month. 2)  Try protonix twice daily until you return for follow-up and notice if cough begins to get easier   Prescriptions: PANTOPRAZOLE SODIUM 40 MG  TBEC (PANTOPRAZOLE SODIUM) 1 twice daily before meals  #60 x prn   Entered and  Authorized by:   Waymon Budge MD   Signed by:   Waymon Budge MD on 02/11/2008   Method used:   Electronically sent to ...       [facility] [address] [address]       [address]       [place], [place]  [address]       Ph: [phone] or [phone]  Fax: [phone]   RxID:   [id number]  ]

## 2010-12-13 NOTE — Op Note (Signed)
NAME:  Kayla Horne, Kayla Horne           ACCOUNT NO.:  [id number]   MEDICAL RECORD NO.:  [id number]          PATIENT TYPE:  INP   LOCATION:  2550                         FACILITY:  [facility]   PHYSICIAN:  Sharolyn Douglas, M.D.        DATE OF BIRTH:  28-Jul-1946   DATE OF PROCEDURE:  02/06/2007  DATE OF DISCHARGE:                               OPERATIVE REPORT   DIAGNOSIS:  Cervical spondylotic radiculopathy.   PROCEDURE:  1. Anterior cervical diskectomy, C5-6 with decompression of the thecal      sac and nerve roots bilaterally.  2. Anterior cervical arthrodesis C5-6, placement of 6 mm allograft      prosthesis spacer packed with local autogenous bone graft.  3. Anterior cervical plating, C5-6 using Abbott spine system.   SURGEON:  Sharolyn Douglas, MD   ASSISTANT SURGEON:  Aura Fey. Bobbe Medico.   ANESTHESIA:  General endotracheal.   ESTIMATED BLOOD LOSS:  50 mL.   COMPLICATIONS:  None.   NEEDLE AND SPONGE COUNT:  Correct.   INDICATIONS:  The patient is a pleasant 65 year old female with  persistent neck and bilateral upper extremity pain thought to be  secondary to C5-6 spondylosis and foraminal narrowing.  She now presents  for ACDF at this level in hopes of improving her symptoms.  Risk,  benefits, alternatives reviewed.   PROCEDURE:  After informed consent she was taken to the operating room.  She was carefully positioned supine with the [place] headrest.  She  underwent general endotracheal anesthesia.  The neck was prepped, draped  usual sterile fashion.  5 pounds of halter traction was applied.  The  transverse incision was made at level of cricoid cartilage in a natural  skin crease on the left side.  Dissection was carried sharply through  the platysma.  The interval between the SCM and strap muscles medially  was developed down to the prevertebral space.  The C4-5 and C5-6 levels  were identified by the anterior osteophytes.  A spinal needle was placed  at C4-5.  Intraoperative x-ray  was taken to confirm this level.  We then  worked down exposing the C5-6 osteophyte and disk space.  The esophagus,  trachea, carotid sheath were identified and protected at all times.  Caspar distraction pins were placed in C5 and C6 vertebral bodies and  gentle distraction was applied.  The anterior osteophyte was removed  with a Leksell rongeur.  The microscope was draped and brought into the  field.  The remainder of the operation was done under the microscope.  3  mm Kerrison punch was used to further remove the anterior overhanging  osteophyte.  A radical diskectomy was then completed back to the  posterior longitudinal ligament.  There were large posterior vertebral  spurs and uncovertebral spurs which were taken down with a high-speed  bur.  A micro Kerrison punch was then used to take down the posterior  longitudinal ligament and complete wide foraminotomies.  The disk space  was then sized to 6 mm.  The cartilaginous endplates were prepared for  the arthrodesis.  A 6 mm allograft prosthesis spacer was  packed with  local autogenous bone graft which was obtained from the drill shavings.  This was inserted into the interspace and countersunk 1 mm.  We then  placed a 20-mm anterior cervical plate from C5 to C6 with four 12 mm  screws.  The bone quality was good, screw purchase was excellent.  We  ensured the locking mechanism engaged.  Hemostasis was achieved.  The  esophagus, trachea, carotid sheath were examined.  There were no  apparent injuries.  Meticulous hemostasis was achieved.  Intraoperative  x-ray was taken which was difficult to evaluate due to the patient's  body habitus but it appeared that the instrumentation was at C5-6.  A  deep TLS drain was left in place.  Platysma closed with interrupted 2-0  Vicryl suture.  Subcutaneous layer closed with 3-0 Vicryl and 4-0 Vicryl  followed by Dermabond on the skin edges.  Sterile dressing applied.  Cervical collar placed.  The  patient was extubated without difficulty,  transferred to recovery in stable condition neurologically intact.   Should be noted my assistant Fannie Knee C. Ireton, P.A. was present throughout  the procedure.  She helped me under the microscope providing suction and  retraction.  She then assisted with instrumentation and arthrodesis.  She assisted with wound closure.      Sharolyn Douglas, M.D.  Electronically Signed     MC/MEDQ  D:  02/06/2007  T:  02/07/2007  Job:  [id number]

## 2010-12-16 NOTE — Op Note (Signed)
NAME:  Kayla Horne, Kayla Horne NO.:  [id number]   MEDICAL RECORD NO.:  [id number]          PATIENT TYPE:  INP   LOCATION:  5032                         FACILITY:  [facility]   PHYSICIAN:  Sharolyn Douglas, M.D.        DATE OF BIRTH:  1945-08-07   DATE OF PROCEDURE:  07/19/2006  DATE OF DISCHARGE:                               OPERATIVE REPORT   DIAGNOSES:  1. Lumbar degenerative spondylolisthesis, L4-5.  2. Multilevel lumbar spinal stenosis.  3. Chronic back and bilateral lower extremity pain, right greater than      left.   PROCEDURES:  1. Lumbar laminectomy L1-2, L2-3, L3-4 and L4-5; with wide      decompression of the thecal sac and nerve roots bilaterally.  2. L4-5 posterior spinal arthrodesis.  3. Pedicle screw instrumentation L4-5, using the Abbott spine system.  4. Transforaminal lumbar interbody fusion, L4-5 with placement of 9-mm      PEEK cage.  5. Local autogenous bone grafting.   SURGEON:  Sharolyn Douglas, M.D.   ASSISTANT:  Verlin Fester, P.A.   ANESTHESIA:  General endotracheal.   ESTIMATED BLOOD LOSS:  200 cc.   COMPLICATIONS:  None.   COUNTS:  Needle and sponge count correct.   INDICATIONS:  The patient is a pleasant 65 year old female with  progressively worsening back and bilateral lower extremity pain.  Her  imaging studies show a degenerative spondylolisthesis at L4-5, with  spinal stenosis from L1 down to L5.  She has failed other conservative  treatment modalities.  She now elects to undergo multilevel lumbar  laminectomy and fusion of the spondylolisthesis at L4-5.  Risks,  benefits, alternatives were reviewed.   PROCEDURE:  After informed consent, the patient was taken to the  operating room.  She underwent general endotracheal anesthesia without  difficulty; given prophylactic IV antibiotics.  Neuro monitoring was  established in the form of SSEPs and lower extremity EMGs.  She was  turned prone onto the [place] frame.  All bony prominences padded.   Face  and eyes protected at all times.  Back was prepped and draped in the  usual sterile fashion.  An incision was made from L1 down to L5 in the  midline.  Dissection was carried sharply through the deep fascia.  A  subperiosteal exposure was carried out to the tips of the transverse  processes of L4 and L5.  The interspace and lamina of L1, L2, and L3  were also exposed.  Intraoperative x-ray confirmed the levels.  We found  that the facet joints at L4-5 were hypertrophied and partially  subluxated.   We began a laminectomy by removing the entire spinous process and lamina  of L1, L2, L3 and L4.  This was completed with the Leksell rongeur, a  high-speed bur and also the Kerrison punches.  Loupes and headlight  magnification were used throughout the procedure.  We found severe  spinal stenosis at L4-5, and to a lesser degree at the cephalad  segments.  A lateral recess decompression was completed flush with the  pedicles.  Each nerve root was identified bilaterally; L2, L3, L4  and L5  -- and decompressed.   Once we were satisfied with the decompression, we turned our attention  to placing pedicle screws at L4 and L5, using anatomic probing  technique.  Each pedicle's starting point was identified with the awl.  The pedicles were then probed and palpated.  There were no breeches.  We  could also palpate the pedicles from within the spinal canal.  The bone  quality was soft and we therefore  placed the longest screws possible  within the vetebral bodies.  We utilized 6.5 x 15 mm screws.  We had  good screw purchase.  Each screw was stimulated using triggered EMGs,  and there were no deleterious changes.   We then turned our attention to performing a transforaminal lumbar  interbody fusion at L4-5 on the right side, to further reduce the  spondylolisthesis and improve the fusion rate.  The exiting and  transversing nerve roots were identified.  The remaining facet joint on  the right  side at L4-5 was osteotomized.  The disk space was entered and  a radical diskectomy completed.  The disk space was dilated up to 9 mm.  The cartilaginous endplates were scraped.  Disk space was packed with  local bone graft, obtained from the laminectomy which had been cleaned  and morselized.  A 9-mm PEEK cage was then packed with bone and inserted  into the interspace, tamped anteriorly and across the midline.  There  were no changes in the free running EMGs during the T-wave procedure.  We completed the posterior spinal fusion by decorticating the transverse  process of L4 and L5 bilaterally, and packing the remaining local bone  graft over the transverse processes.  We placed 40 mm titanium rods into  the polyaxial screw heads, and gentle compression was applied before  shearing off the locking caps..  Intraoperative x-ray showed appropriate  positioning of the pedicle screws and PEEK cage.  One of the screws at  L5 just breeched the anterior cortex , and we were satisfied with this.  Considering the patient's bone quality, the additional fixation was felt  to be necessary.  Hemostasis was achieved.  The wound was irrigated.  Gelfoam left over the exposed epidural space.  A deep Hemovac drain was  left in place.  The deep fascia was closed with a running #1 Vicryl  suture.  Subcutaneous layer closed with 2-0 Vicryl suture, followed by a  running 3-0 subcuticular Vicryl suture, and then Dermabond on the skin  edges.  Sterile dressing applied.  The patient was turned supine and  extubated without difficulty.  Transferred to recovery stable condition,  able to move her upper and lower extremities.   It should be noted my assistant [facility], PA was present throughout  the procedure including the positioning, the exposure, the  decompression, the instrumentation and the arthrodesis.  She also  assisted with wound closure.      Sharolyn Douglas, M.D.  Electronically Signed      MC/MEDQ  D:  07/19/2006  T:  07/20/2006  Job:  [id number]

## 2010-12-16 NOTE — H&P (Signed)
NAME:  Kayla Horne, Kayla Horne           ACCOUNT NO.:  [id number]   MEDICAL RECORD NO.:  [id number]          PATIENT TYPE:  INP   LOCATION:  5032                         FACILITY:  [facility]   PHYSICIAN:  Sharolyn Douglas, M.D.        DATE OF BIRTH:  04-28-1946   DATE OF ADMISSION:  07/19/2006  DATE OF DISCHARGE:  07/23/2006                              HISTORY & PHYSICAL   CHIEF COMPLAINT:  Low back and bilateral lower extremity pain.   HISTORY OF PRESENT ILLNESS:  Patient is a 65 year old female who is  having increasing back and lower extremity pain.  She has failed to  improve with conservative management.  She was found to have a  spondylolisthesis as well as spondylosis in her low back.  The best  course of management was thought to be L4-5 fusion and laminectomy  secondary to her failure to improve as well as her x-ray and MRI  findings.  Risks and benefits of the surgery were discussed with the  patient by Dr. Noel Gerold as well as myself.  She indicated understanding and  opted to proceed.   ALLERGIES:  MORPHINE.   MEDICATIONS:  See hospital chart.   PAST MEDICAL HISTORY:  1. Hypothyroidism.  2. GERD.  3. Depression.  4. Restless leg syndrome.  5. Occasional swelling in her extremities.  6. Anxiety attacks.   PAST SURGICAL HISTORY:  1. Cholecystectomy.  2. Hernia repair.  3. Hysterectomy.  4. Breast reduction.   SOCIAL HISTORY:  Patient denies tobacco use, denies alcohol use.  She is  married and her family will be available to help her postoperatively.   FAMILY MEDICAL HISTORY:  Noncontributory.   REVIEW OF SYSTEMS:  Is negative.   PHYSICAL EXAM:  VITALS:  Respirations 16 and unlabored, pulse is 76 and  regular.  GENERAL APPEARANCE:  The patient is a 65 year old white female who is  alert and oriented, no acute distress.  She is well nourished, well  groomed, appears her stated age, pleasant and cooperative to exam.  HEENT:  Head:  Normocephalic, atraumatic.  Pupils are  equal, round, and  reactive to light.  Extraocular movements intact.  Nares parent.  Oropharynx is clear.  NECK:  Supple to palpation.  No lymphadenopathy, thyromegaly, or bruits  appreciated.  CHEST:  Clear to auscultation bilaterally.  No rales, rhonchi, stridor,  wheezes, friction, or rubs.  BREASTS:  Not pertinent, not performed.  HEART:  S1 and S2 regular rate and rhythm.  No murmurs, gallops, or rubs  noted.  ABDOMEN:  Soft to palpation, nontender, nondistended.  No organomegaly  noted.  GU:  Not pertinent, not performed.  EXTREMITIES:  As per HPI.  SKIN:  Intact, no lesions or rashes.   IMPRESSION:  1. L4-5 spondylolisthesis.  2. Hypothyroidism.  3. Gastroesophageal reflux disease.  4. Depression.  5. Restless leg syndrome.  6. Anxiety attacks.   PLAN:  Admit to [facility] on July 19, 2006 for an L4-5  laminectomy and posterior spinal fusion.  This will be done by Dr.  Noel Gerold.   POSTOPERATIVELY:  We will use Dilaudid as well as  a rehab consult.      Verlin Fester, P.A.      Sharolyn Douglas, M.D.  Electronically Signed    CM/MEDQ  D:  07/23/2006  T:  07/24/2006  Job:  [id number]   cc:   Sharolyn Douglas, M.D.

## 2010-12-16 NOTE — Op Note (Signed)
NAMELANDI, Kayla Horne           ACCOUNT NO.:  [id number]   MEDICAL RECORD NO.:  [id number]          PATIENT TYPE:  AMB   LOCATION:  DSC                          FACILITY:  [facility]   PHYSICIAN:  Leonides Grills, M.D.     DATE OF BIRTH:  Nov 29, 1945   DATE OF PROCEDURE:  08/15/2005  DATE OF DISCHARGE:                                 OPERATIVE REPORT   PREOPERATIVE DIAGNOSES:  1.  Right hallux valgus.  2.  Right bunionette.  3.  Right second hammer toe.   POSTOPERATIVE DIAGNOSES:  1.  Right hallux valgus.  2.  Right bunionette.  3.  Right second hammer toe.   OPERATION PERFORMED:  1.  Right chevron bunionectomy.  2.  Stress x-rays right foot.  3.  Partial excision of fifth metatarsal head.  4.  Right second toe metatarsophalangeal joint dorsal capsulotomy with      collateral release.  5.  Right second toe proximal phalanx head resection.  6.  Right second toe extensor digitorum brevis to extensor digitorum longus      tendon transfer.   SURGEON:  Leonides Grills, M.D.   ASSISTANT:  Lianne Cure, P.A.   ANESTHESIA:  General with block.   ESTIMATED BLOOD LOSS:  Minimal.   TOURNIQUET TIME:  Approximately an hour.   COMPLICATIONS:  None.   DISPOSITION:  Stable to PR.   INDICATIONS FOR PROCEDURE:  The patient is a 65 year old female with  longstanding bunion pain that was interfering with her life to the point  where she cannot do what she wants to do with asymptomatic hammer toe.  The  patient  has consented for the above procedure.  All risks which include  infection, neurovascular injury, nonunion, malunion, hardware irritation,  hardware failure, persistent pain, worsening pain, prolonged recovery,  stiffness, arthritis, recurrence of deformity were all explained, questions  were encouraged and answered.   DESCRIPTION OF PROCEDURE:  The patient was brought to the operating room and  placed in supine position after adequate general endotracheal tube  anesthesia was  administered as well as Ancef 1 g IV piggyback.  The right  lower extremity was then prepped and draped in sterile manner over a  proximally placed thigh tourniquet.  The limb was gravity exsanguinated and  the tourniquet was elevated to 290 mmHg.  A longitudinal incision over the  medial aspect of the midline over the great toe metatarsophalangeal joint  was then made.  Dissection was carried down through the skin and hemostasis  was obtained.  Neurovascular structures were identified both superiorly and  inferiorly and protected throughout the case.  An L-shaped capsulotomy was  then made, simple bunionectomies were then performed with a sagittal saw,  lateral capsule was then released with a curved Beaver blade.  The center  head was then identified.  Soft tissue was elevated both superiorly,  inferiorly and protected and a Chevron osteotomy was then created with a  sagittal saw.  The head was then translated approximately 3 to 4 mm  laterally and fixed with a 2.0 mm fully threaded cortical set screw using  1.5 mm drill hole respectively.  This was countersunk.  The area was  copiously irrigated with normal saline.  Alona Bene ridge was then rounded  off with a rongeur after redundant bone medially was osteotomized with the  sagittal saw.  Once this was adequately irrigated and the capsule was  advanced both superiorly and proximally and reconstructed with a 2-0 Vicryl  suture.  This had an Conservation officer, historic buildings.  The toe was ranged and had  excellent range of motion and was in excellent alignment.  We then made a  longitudinal incision over the second toe dorsal midline, dissection was  carried out through the skin and hemostasis was obtained.  Extensor  digitorum longus and brevis tendons were identified.  The longus tendon was  tenotomized proximal and medial and the brevis was tenotomized distal  lateral and retracted out of harm's way for later transfer. The distal  aspect of the  proximal phalanx was then skeletonized and the head was then  removed with a rongeur followed by a bone cutter. This was done  perpendicular to the long axis of the shaft of the proximal phalanx.  Then  an MTP joint dorsal capsulotomy with collateral release has been performed  protecting the soft tissues both medially and laterally with a 15 blade  scalpel.  We then placed a K-wire antegrade through the middle and distal  phalanx, reduced the proximal interphalangeal joint and fired this  retrograde with the toe held in reduced position across the MTP joint.  This  held the toe in excellent position.  We then performed the EDB to EDL tendon  transfer using 3-0 PDS.  This had an Conservation officer, historic buildings.  Skin relieving  incisions were made on either side of the K-wire.  The K-wire was bent, cut  and capped.  Final stress x-rays were obtained in the AP and lateral plane  and showed no gross motion across the osteotomy site,  fixation in proper  position and correction in excellent position as well.  Tourniquet was  deflated, hemostasis was obtained and skin was closed with 4-0 nylon suture  over all wounds.  Sterile dressing was applied.  A Roger Mann dressing was  applied.  Hard sole shoe was applied.  The patient was stable to the PR.      Leonides Grills, M.D.  Electronically Signed     PB/MEDQ  D:  08/15/2005  T:  08/15/2005  Job:  [id number]

## 2010-12-16 NOTE — Discharge Summary (Signed)
NAMEMarland Kitchen  Kayla Horne, Kayla Horne           ACCOUNT NO.:  [id number]   MEDICAL RECORD NO.:  [id number]          PATIENT TYPE:  INP   LOCATION:  5032                         FACILITY:  [facility]   PHYSICIAN:  Sharolyn Douglas, M.D.        DATE OF BIRTH:  February 11, 1946   DATE OF ADMISSION:  07/19/2006  DATE OF DISCHARGE:  07/23/2006                               DISCHARGE SUMMARY   ADMITTING DIAGNOSES:  1. 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  2. Hypothyroidism.  3. Gastroesophageal reflux disease.  4. Depression.  5. Restless leg syndrome.  6. Anxiety.   DISCHARGE DIAGNOSES:  1. Status post L1-5 laminectomy and L4-5 fusion.  2. Postoperative hemorrhagic anemia.  3. Postoperative hyperglycemia and postop hypokalemia.  4. Postop hyponatremia.   PROCEDURE:  On July 19, 2006, patient was taken to the operating  room for an L1-5 laminectomy and L4-5 posterior spinal fusion.   SURGEON:  Sharolyn Douglas, MD.   ASSISTANTJill Side Mahar, PA-C.   ANESTHESIA:  General.   CONSULTS:  None.   LABS:  CBC with differential, preop, was normal.  H&H monitored x3 days,  postop, reached a low of 10.3 and 29.4.  Coags, preop, normal.  Complete  metabolic panel, preop, showed a potassium of 3.1, glucose of 225, AST  40, ALT of 46; otherwise, normal.  BMET, done 2 days postoperatively, on  postop day 1, showed a glucose of 225, calcium of 7.9.  Postop day 2,  sodium was 130, potassium 2.9, glucose 133, calcium 7.2; otherwise,  normal.  UA from December 20 was negative.  Blood typing was AB  negative.  Antibody screen is negative.  Urine culture, from July 19, 2006, shows multiple bacteria suggesting inappropriate collection.  EKG, from July 18, 2006, showed normal sinus rhythm, inferior  infarct age undetermined.  Poor R wave progression.  No significant  change since January of 2007, read by Dr. Eldridge Dace.  We used __________  for localization.   BRIEF HISTORY:  Patient is a  65 year old female who has had a long  history of problems with her back.  It has progressively been getting  worse and extending into her bilateral lower extremities.  __________  degenerative spondylolisthesis at L4-5 and then spinal stenosis from L1-  L5.  She failed all the conservative treatments and elected to undergo  the above-listed procedure.   HOSPITAL COURSE:  Patient is a 65 year old female who was admitted to  the hospital on July 19, 2006 for the above-listed procedure.  She  was taken to the operating room and tolerated the procedure well without  any intraoperative complications.  She was transferred to the recovery  room in stable condition.   Postoperatively, routine orthopedic spine protocol was followed.  The  patient progressed along very well.  Did not develop any significant  postoperative complications.   Physical therapy and occupational therapy worked with patient on daily  basis with an aggressive ambulation program.  They also worked on brace  use and back precautions.   By July 23, 2006, patient had met all orthopedic goals.  She is  independent and  safe with all therapies.  Medically, she was stable and  ready for discharge.   DISCHARGE PLAN:  Patient is a 65 year old female, status post L1-5  laminectomy and L4-5 posterior spinal fusion, doing well.   ACTIVITY:  Daily ambulation program.  Brace on when she is up.  Back  precautions at all times.  No lifting heavier than 5 pounds.  Patient  may shower.   FOLLOWUP:  Two weeks postoperatively with Dr. Noel Gerold.   MEDICATIONS ON DISCHARGE:  1. Vicodin p.r.n. pain  2. Robaxin p.r.n. muscle spasm.  3. Multivitamin daily.  4. Calcium 1200 daily.  5. Colace 100 mg twice daily.  6. Laxative as needed.  7. Avoid NSAIDs.   CONDITION ON DISCHARGE:  Stable and improved.   DISPOSITION:  Patient will be discharged to her home with family's  assistance.      Verlin Fester, P.A.      Sharolyn Douglas, M.D.  Electronically Signed    CM/MEDQ  D:  12/13/2006  T:  12/13/2006  Job:  [id number]

## 2010-12-16 NOTE — Op Note (Signed)
[place]  Patient:    Kayla Horne, Kayla Horne                  MRN: [id number] Proc. Date: 06/25/00 Adm. Date:  [id number] Attending:  Gustavus Messing CC:         Yaakov Guthrie. Shon Hough, M.D.   Operative Report  PREOPERATIVE DIAGNOSIS:  POSTOPERATIVE DIAGNOSIS:  OPERATION PERFORMED:  Bilateral breast reductions using the inferior pedicle technique.  SURGEON:  Yaakov Guthrie. Shon Hough, M.D.  ANESTHESIA:  General.  ASSISTANT:  Margaretha Sheffield, RN  INDICATIONS FOR PROCEDURE:  Ms. Tinkle is a 65 year old lady who has severe, severe macromastia.  She is only 5 feet 2 inches and wears an F bra. She has had increased back and shoulder pain secondary to her large pendulous breasts causing increased irritating and intertrigo.  These complaints have increased in time and at this point in time, she is ready to proceed with bilateral breast reductions which were explained to her preoperatively.  She also had increased accessory breast tissue in the right and left maxillary areas.  DESCRIPTION OF PROCEDURE:  Preoperatively, the patient was set up and drawn for the inferior pedicle reduction mammoplasty.  Remarking the nipple areolar complexes from over 40 cm to 21 from the supersternal notch.  She then underwent general anesthesia, intubated orally.  Prep was done to the chest and breast areas in a routine fashion using Betadine soap and solution and walled off with sterile towels and draped so as to make a sterile field.  The mark was scored with a #15 blades and then the skin of the inferior pedicle was de-epithelialized with #20 blade.  The medial and lateral fatty dermal pedicles were excised down to underlying pectoralis fascia.  Next, a new keyhole area was also debulked and also increased ancillary breast tissue was removed using sharp and blunt dissection from the axillary serratus anterior and latissimus dorsi areas.  After proper  hemostasis, the flaps were transposed and stayed with 3-0 Prolene, subcutaneous closure was done with 3-0 Monocryl x 2 layers and a running subcuticular stitches of 3-0 Monocryl and 5-0 Monocryl throughout the inverted T.  The wounds were drained with #10 fully fluted Blake drains which were placed in the depths of the wound and brought out through lateral ____________  and secured with 3-0 Prolene.  Same procedure was carried out on both sides removing over 12 gm per side. Estimated blood loss for this procedure was less than 100 cc.  The wounds were cleansed.  Half inch Steri-Strips and soft dressing were applied to all areas and occluded with Xeroform, 4 x 4s, ABDs, Hypafix tape.  She withstood the procedures very well.  She left the operating room in good condition.  The nipple areolar complex showed very nice vasculature.  She was then taken to recovery to be monitored. DD:  06/25/00 TD:  06/25/00 Job: [id number]

## 2011-05-16 LAB — CBC
HCT: 43.1
Hemoglobin: 14.7
MCHC: 34.1
MCV: 87.4
Platelets: 287
RBC: 4.93
RDW: 13.8
WBC: 7.9

## 2011-05-16 LAB — URINE CULTURE
Colony Count: NO GROWTH
Culture: NO GROWTH

## 2011-05-16 LAB — COMPREHENSIVE METABOLIC PANEL
ALT: 35
AST: 35
Albumin: 4.1
Alkaline Phosphatase: 116
BUN: 20
CO2: 26
Calcium: 9.5
Chloride: 105
Creatinine, Ser: 0.73
GFR calc non Af Amer: >60
Glucose, Bld: 118 — ABNORMAL HIGH
Potassium: 3.8
Sodium: 139
Total Bilirubin: 1.1
Total Protein: 6.8

## 2011-05-16 LAB — DIFFERENTIAL
Basophils Absolute: 0.1
Basophils Relative: 1
Eosinophils Absolute: 0.2
Eosinophils Relative: 3
Lymphocytes Relative: 40
Lymphs Abs: 3.2
Monocytes Absolute: 0.3
Monocytes Relative: 4
Neutro Abs: 4.1
Neutrophils Relative %: 52

## 2011-05-16 LAB — TYPE AND SCREEN
ABO/RH(D): AB NEG
Antibody Screen: NEGATIVE

## 2011-05-16 LAB — URINALYSIS, ROUTINE W REFLEX MICROSCOPIC
Bilirubin Urine: NEGATIVE
Glucose, UA: NEGATIVE
Hgb urine dipstick: NEGATIVE
Ketones, ur: NEGATIVE
Nitrite: NEGATIVE
Protein, ur: NEGATIVE
Specific Gravity, Urine: 1.009
Urobilinogen, UA: 0.2
pH: 6

## 2011-05-16 LAB — PROTIME-INR
INR: 1
Prothrombin Time: 13.1

## 2011-05-16 LAB — APTT: aPTT: 25

## 2012-06-21 ENCOUNTER — Emergency Department (HOSPITAL_COMMUNITY): Payer: Medicare Other

## 2012-06-21 ENCOUNTER — Encounter (HOSPITAL_COMMUNITY): Payer: Self-pay | Admitting: Emergency Medicine

## 2012-06-21 ENCOUNTER — Emergency Department (HOSPITAL_COMMUNITY)
Admission: EM | Admit: 2012-06-21 | Discharge: 2012-06-21 | Disposition: A | Discharge status: Home / Self Care | Payer: Medicare Other | Attending: Emergency Medicine | Admitting: Emergency Medicine

## 2012-06-21 DIAGNOSIS — R079 Chest pain, unspecified: Secondary | ICD-10-CM

## 2012-06-21 DIAGNOSIS — Z79899 Other long term (current) drug therapy: Secondary | ICD-10-CM | POA: Insufficient documentation

## 2012-06-21 DIAGNOSIS — R072 Precordial pain: Secondary | ICD-10-CM | POA: Insufficient documentation

## 2012-06-21 LAB — CBC WITH DIFFERENTIAL/PLATELET
Basophils Absolute: 0.1 10*3/uL (ref 0.0–0.1)
Basophils Relative: 0 % (ref 0–1)
Eosinophils Absolute: 0.2 10*3/uL (ref 0.0–0.7)
Eosinophils Relative: 2 % (ref 0–5)
HCT: 41.8 % (ref 36.0–46.0)
Hemoglobin: 14.9 g/dL (ref 12.0–15.0)
Lymphocytes Relative: 28 % (ref 12–46)
Lymphs Abs: 3.2 10*3/uL (ref 0.7–4.0)
MCH: 31.1 pg (ref 26.0–34.0)
MCHC: 35.6 g/dL (ref 30.0–36.0)
MCV: 87.3 fL (ref 78.0–100.0)
Monocytes Absolute: 0.6 10*3/uL (ref 0.1–1.0)
Monocytes Relative: 5 % (ref 3–12)
Neutro Abs: 7.3 10*3/uL (ref 1.7–7.7)
Neutrophils Relative %: 64 % (ref 43–77)
Platelets: 223 10*3/uL (ref 150–400)
RBC: 4.79 MIL/uL (ref 3.87–5.11)
RDW: 12.7 % (ref 11.5–15.5)
WBC: 11.4 10*3/uL — ABNORMAL HIGH (ref 4.0–10.5)

## 2012-06-21 LAB — BASIC METABOLIC PANEL
BUN: 20 mg/dL (ref 6–23)
CO2: 29 mEq/L (ref 19–32)
Calcium: 10.4 mg/dL (ref 8.4–10.5)
Chloride: 101 mEq/L (ref 96–112)
Creatinine, Ser: 0.77 mg/dL (ref 0.50–1.10)
GFR calc Af Amer: >90 mL/min (ref >90)
GFR calc non Af Amer: 86 mL/min — ABNORMAL LOW (ref >90)
Glucose, Bld: 113 mg/dL — ABNORMAL HIGH (ref 70–99)
Potassium: 3.7 mEq/L (ref 3.5–5.1)
Sodium: 141 mEq/L (ref 135–145)

## 2012-06-21 LAB — TROPONIN I: Troponin I: <0.3 ng/mL (ref <0.30)

## 2012-06-21 NOTE — ED Notes (Addendum)
Pt in via EMS, per EMS- pt in c/o substernal chest pain that radiated into her back starting approx 1-2 hours ago. Pt started while patient was grocery shopping, rated pain 3/10 upon EMS arrival, given 324 ASA and nitro SL x2. Pt remains 3/10 at this time. Denies other symptoms at this time, no distress noted. Pt states she has had multiple recent episodes of chest pain and testing has come back negative so far.

## 2012-06-21 NOTE — ED Provider Notes (Signed)
History     CSN: [id number]  Arrival date & time 06/21/12  1457   First MD Initiated Contact with Patient 06/21/12 1529     Chief Complaint  Patient presents with  . Chest Pain   HPI: Kayla Horne is a 66 yo CF with history of DM and HTN presents with chest pain. Episode started one hour prior to arrival while she was driving her car. Pain was acute onset, substernal, radiating to her back, described as pressure, not pleuretic, 10/10, no exacerbating factors. Not associated with nausea, vomiting or diaphoresis. She called EMS, was given ASA 324, and nitro X2 with improvement of pain to 3/10. She has had two previous episodes over the last two months. Seen at [facility] and evaluated with blood work, EKG and CXR all were reportedly normal. Similar episode one year ago and underwent stress test which again was reportedly normal. Five years ago she underwent catheterization. She has a chronic cough, non-productive, denies fever or chills.   No past medical history on file.  No past surgical history on file.  No family history on file.  History  Substance Use Topics  . Smoking status: Not on file  . Smokeless tobacco: Not on file  . Alcohol Use: Not on file    OB History    No data available     Review of Systems  Constitutional: Negative for fever, chills and appetite change.  HENT: Negative for congestion and rhinorrhea.   Eyes: Negative for photophobia and visual disturbance.  Respiratory: Negative for cough and shortness of breath.   Cardiovascular: Positive for chest pain. Negative for leg swelling.  Gastrointestinal: Negative for nausea, vomiting and abdominal pain.  Genitourinary: Negative for dysuria, decreased urine volume and difficulty urinating.  Musculoskeletal: Negative for myalgias and arthralgias.  Skin: Negative for pallor and rash.  Neurological: Negative for dizziness, syncope and headaches.  Psychiatric/Behavioral: Negative for confusion and agitation.  All  other systems reviewed and are negative.   Allergies  Latex; Morphine; and Penicillins  Home Medications   Current Outpatient Rx  Name  Route  Sig  Dispense  Refill  . ALPRAZOLAM 1 MG PO TABS   Oral   Take 0.5-1 mg by mouth 3 (three) times daily as needed. For anxiety         . AMLODIPINE BESYLATE 5 MG PO TABS   Oral   Take 5 mg by mouth daily.         . DULOXETINE HCL 60 MG PO CPEP   Oral   Take 60 mg by mouth daily.         . FUROSEMIDE 40 MG PO TABS   Oral   Take 40 mg by mouth daily.         Marland Kitchen GLIPIZIDE 10 MG PO TABS   Oral   Take 10 mg by mouth 2 (two) times daily before a meal.         . LEVOTHYROXINE SODIUM 112 MCG PO TABS   Oral   Take 112 mcg by mouth daily.         Marland Kitchen LOSARTAN POTASSIUM 100 MG PO TABS   Oral   Take 100 mg by mouth daily.         Marland Kitchen METFORMIN HCL 500 MG PO TABS   Oral   Take 1,000 mg by mouth 2 (two) times daily with a meal.         . PANTOPRAZOLE SODIUM 40 MG PO TBEC   Oral  Take 40 mg by mouth daily.         Marland Kitchen PRAMIPEXOLE DIHYDROCHLORIDE 0.5 MG PO TABS   Oral   Take 0.5 mg by mouth 3 (three) times daily.           BP 90/68  Pulse 93  Temp 98.1 F (36.7 C)  Resp 20  SpO2 96%  Physical Exam  Nursing note and vitals reviewed. Constitutional: She is oriented to person, place, and time. She appears well-developed and well-nourished.  HENT:  Head: Normocephalic and atraumatic.  Eyes: EOM are normal. Pupils are equal, round, and reactive to light.  Neck: Normal range of motion. Neck supple.  Cardiovascular: Normal rate, regular rhythm and normal heart sounds.   Pulmonary/Chest: Effort normal and breath sounds normal.  Abdominal: Soft. Bowel sounds are normal.  Musculoskeletal: Normal range of motion. She exhibits no edema.  Neurological: She is alert and oriented to person, place, and time.  Skin: Skin is warm and dry.    ED Course  Procedures  Labs Reviewed  CBC WITH DIFFERENTIAL - Abnormal; Notable  for the following:    WBC 11.4 (*)     All other components within normal limits  BASIC METABOLIC PANEL - Abnormal; Notable for the following:    Glucose, Bld 113 (*)     GFR calc non Af Amer 86 (*)     All other components within normal limits  TROPONIN I   Dg Chest 2 View  06/21/2012  *RADIOLOGY REPORT*  Clinical Data: Chest pain and cough.  CHEST - 2 VIEW  Comparison: PA and lateral chest 09/06/2009 single view of the chest 05/29/2012.  Findings: Mild scar in the periphery of the left lung base is unchanged.  The lungs are otherwise clear.  Heart size is normal. No pneumothorax or pleural fluid.  IMPRESSION: No acute disease.   Original Report Authenticated By: Holley Dexter, M.D.    1. Chest pain    MDM   66 yo CF with history of DM and HTN presents with chest pain. Afebrile, vital signs stable. Concern for ACS vs GERD vs anxiety vs esophageal spasm. ASA and nitro given by EMS. Doubt PE as character not typical, pain relieved with nitro, not tachycardic or hypoxic, low risk by Well's. Doubt PNA as CXR without consolidation, no fever or cough. CXR without effusion, consolidation, widened mediastinum, PTX. EKG with no acute ischemia or infarction. First troponin negative. WBC 11.4, all other labs normal. Offered admission for formal ACS r/o but patient declined. She states she has to take care of her son. Advised she may be suffering an MI and that leaving may result in serious disability or death. She still refused. She states she will f/u with her PCP on Monday. Patient was pain free prior to DC. Advised her to return for recurrent pain, trouble breathing, vomiting or other concerning symptoms. She is in agreement with plan.   Reviewed imaging, labs, EKG, and previous medical records, utilized in MDM  Discussed case with Dr. Anitra Lauth  EKG: SR, rate 65, left axis deviation, normal intervals, Q wave in leads aVR and III, no other St or T wave abnormalities. No change from EKG from  06/2006  Clinical Impression 1. Chest pain 2. Leukocytosis           Margie Billet, MD 06/22/12 [phone]

## 2012-06-22 NOTE — ED Provider Notes (Signed)
I saw and evaluated the patient, reviewed the resident's note and I agree with the findings and plan. I have reviewed EKG and agree with the resident interpretation.  you Patient with multiple cardiac risk factors who had an episode of chest pain that lasted approximately one hour today while she was driving her car without associated symptoms. She states she's had 4 episodes total in a stress test last year that was within normal limits. She states every time she gets the pain she goes to the emergency room but they have never found anything. Patient is chest pain free after getting aspirin and nitroglycerin. She also states she's been under a lot of stress and has not take her protonix today. Her EKG is unrevealing and labs are within normal limits.  Discussed with patient admission because she's been evaluated several times in the ER and discharged home without for complete evaluation in the last year. However she states she is unwilling to stay and understands the risk of missing a possible coronary event. She was to followup with cardiology as an outpatient  Gwyneth Sprout, MD 06/22/12 1827

## 2012-06-26 ENCOUNTER — Ambulatory Visit: Payer: Medicare Other | Admitting: Cardiovascular Disease

## 2012-07-01 ENCOUNTER — Encounter: Payer: Self-pay | Admitting: Cardiology

## 2012-07-05 ENCOUNTER — Encounter: Payer: Self-pay | Admitting: Cardiology

## 2012-07-05 ENCOUNTER — Encounter (INDEPENDENT_AMBULATORY_CARE_PROVIDER_SITE_OTHER): Payer: Medicare Other

## 2012-07-05 ENCOUNTER — Ambulatory Visit (INDEPENDENT_AMBULATORY_CARE_PROVIDER_SITE_OTHER): Payer: Medicare Other | Admitting: Cardiology

## 2012-07-05 VITALS — BP 137/83 | HR 72 | Ht 61.0 in | Wt 164.0 lb

## 2012-07-05 DIAGNOSIS — R55 Syncope and collapse: Secondary | ICD-10-CM

## 2012-07-05 NOTE — Progress Notes (Signed)
Patient ID: Kayla Horne, female   DOB: 22-Oct-1945, 66 y.o.   MRN: [id number] Placed an 21 day event monitor on patient and went over instruction on how to use monitor and how to return the monitor. Serial [id number]

## 2012-07-05 NOTE — Progress Notes (Signed)
HPI The patient presents for evaluation of syncope. She had this a year ago and was seen at [place].  I don't have any of this work up.  She had an episode about a month ago.  She says she had eaten a small piece of pizza and then her eyes rolled back, she couldn't communicate, she vomited and apparently lost consciousness.  She was evaluated at [facility] there was no clear etiology. I reviewed some outpatient records. She was not noted to be hypoglycemic.Marland Kitchen She had a second spell about one month later similar. Most recently she developed chest discomfort while driving. This went through to her back. There were no associated symptoms at that time.  She did not have any syncope at that time. She hasn't really noticed any palpitations with these events. She otherwise does some walking and keeps her house with firewood and hasn't had any problems getting this. She is limited by some muscle aches and joint pains. However, she's not had any chest pressure, neck or arm discomfort. I did review ER records from her most recent evaluation for chest pain this demonstrated no significant abnormalities. Of note reading outside records  She has had problems with sleep, depression and anxiety social issues.   Allergies  Allergen Reactions  . Latex   . Morphine     Current Outpatient Prescriptions  Medication Sig Dispense Refill  . ALPRAZolam (XANAX) 1 MG tablet Take 0.5-1 mg by mouth 3 (three) times daily as needed. For anxiety      . amLODipine (NORVASC) 5 MG tablet Take 5 mg by mouth daily.      Marland Kitchen aspirin 81 MG tablet Take 81 mg by mouth daily.      . DULoxetine (CYMBALTA) 60 MG capsule Take 60 mg by mouth daily.      . furosemide (LASIX) 40 MG tablet Take 40 mg by mouth daily.      Marland Kitchen glipiZIDE (GLUCOTROL) 10 MG tablet Take 10 mg by mouth 2 (two) times daily before a meal.      . levothyroxine (SYNTHROID, LEVOTHROID) 112 MCG tablet Take 112 mcg by mouth daily.      Marland Kitchen losartan (COZAAR) 100 MG tablet  Take 100 mg by mouth daily.      . metFORMIN (GLUCOPHAGE) 500 MG tablet Take 1,000 mg by mouth 2 (two) times daily with a meal.      . nitroGLYCERIN (NITROSTAT) 0.4 MG SL tablet Place 0.4 mg under the tongue every 5 (five) minutes as needed.      . pantoprazole (PROTONIX) 40 MG tablet Take 40 mg by mouth daily.      . pramipexole (MIRAPEX) 0.5 MG tablet Take 0.5 mg by mouth 3 (three) times daily.        Past Medical History  Diagnosis Date  . DEGENERATIVE DISC DISEASE 10/12/2008    Qualifier: Diagnosis of  By: Maple Hudson MD, Clinton D   . DIABETES, TYPE 2 02/16/2008    Qualifier: Diagnosis of  By: Maple Hudson MD, Clinton D   . Esophageal reflux 02/11/2008    Centricity Description: GASTROESOPHAGEAL REFLUX DISEASE, CHRONIC Qualifier: Diagnosis of  By: Maple Hudson MD, Rennis Chris  Centricity Description: Gean Maidens D Qualifier: Diagnosis of  By: Maple Hudson MD, Clinton D   . Hemoptysis 10/12/2008    Qualifier: Diagnosis of  By: Maple Hudson MD, Clinton D   . Anxiety   . Depression   . Hypothyroidism   . Hypertension     Past Surgical History  Procedure  Date  . Abdominal hysterectomy   . Knee surgery     Arthroscopic right  . Cervical spine surgery   . Breast reduction surgery   . Lumbar disc surgery   . Cholecystectomy     Family History  Problem Relation Age of Onset  . Stroke Father 64  . CAD Brother 91    CABG  . CAD Brother 53    CABG    History   Social History  . Marital Status: Married    Spouse Name: N/A    Number of Children: N/A  . Years of Education: N/A   Occupational History  . Not on file.   Social History Main Topics  . Smoking status: Current Some Day Smoker    Types: Cigarettes  . Smokeless tobacco: Not on file     Comment: 5 -7 cigarettes a week  . Alcohol Use: No  . Drug Use: No  . Sexually Active: Not on file   Other Topics Concern  . Not on file   Social History Narrative   Lives with two dogs and two cats.  One adopted son.      ROS:  Positive for diarrhea, reflux,  arthritis, rhinitis, edema.Otherwise as stated in the HPI and negative for all other systems.   PHYSICAL EXAM BP 137/83  Pulse 72  Ht 5\' 1"  (1.549 m)  Wt 164 lb (74.39 kg)  BMI 30.99 kg/m2 GENERAL:  Well appearing HEENT:  Pupils equal round and reactive, fundi not visualized, oral mucosa unremarkable NECK:  No jugular venous distention, waveform within normal limits, carotid upstroke brisk and symmetric, no bruits, no thyromegaly LYMPHATICS:  No cervical, inguinal adenopathy LUNGS:  Clear to auscultation bilaterally BACK:  No CVA tenderness CHEST:  Unremarkable HEART:  PMI not displaced or sustained,S1 and S2 within normal limits, no S3, no S4, no clicks, no rubs, no murmurs ABD:  Flat, positive bowel sounds normal in frequency in pitch, no bruits, no rebound, no guarding, no midline pulsatile mass, no hepatomegaly, no splenomegaly EXT:  2 plus pulses throughout, no edema, no cyanosis no clubbing SKIN:  No rashes no nodules NEURO:  Cranial nerves II through XII grossly intact, motor grossly intact throughout PSYCH:  Cognitively intact, oriented to person place and time  EKG:  Sinus rhythm, rate 80, axis is leftward, intervals within normal limits, no acute ST-T wave changes.  04/11/12  ASSESSMENT AND PLAN  Syncope - The symptoms are somewhat atypical. However, I will start with a 21 day event monitor. I would like to get records from Dr. Shary Decamp to see if there is any prior cardiac workup. I will see her back in the event and discuss any further testing based on her previous workup and any evidence of arrhythmias or further symptoms.    HTN - Her blood pressure is currently controlled. She will continue the meds as listed.   Tobaco use - She understands the need to stop smoking

## 2012-07-05 NOTE — Patient Instructions (Addendum)
The current medical regimen is effective;  continue present plan and medications.  Your physician has recommended that you wear an event monitor for 21 days. Event monitors are medical devices that record the heart's electrical activity. Doctors most often [place] these monitors to diagnose arrhythmias. Arrhythmias are problems with the speed or rhythm of the heartbeat. The monitor is a small, portable device. You can wear one while you do your normal daily activities. This is usually used to diagnose what is causing palpitations/syncope (passing out).  Follow up with Dr Antoine Poche after your monitor.

## 2012-08-13 ENCOUNTER — Ambulatory Visit: Payer: Medicare Other | Admitting: Cardiology

## 2012-08-28 ENCOUNTER — Ambulatory Visit: Payer: Medicare Other | Admitting: Cardiology

## 2012-09-23 ENCOUNTER — Ambulatory Visit: Payer: Medicare Other | Admitting: Cardiology

## 2012-09-26 ENCOUNTER — Telehealth: Payer: Self-pay | Admitting: *Deleted

## 2012-09-26 NOTE — Telephone Encounter (Signed)
error 

## 2012-10-07 ENCOUNTER — Ambulatory Visit: Payer: Medicare Other | Admitting: Cardiology

## 2012-10-31 ENCOUNTER — Ambulatory Visit: Payer: Self-pay | Admitting: Cardiology

## 2012-10-31 ENCOUNTER — Encounter: Payer: Self-pay | Admitting: *Deleted

## 2013-05-12 ENCOUNTER — Telehealth: Payer: Self-pay | Admitting: Neurology

## 2013-05-12 NOTE — Telephone Encounter (Signed)
Pt need appt

## 2013-05-14 NOTE — Telephone Encounter (Signed)
Reassigned to Dr. Yan. 

## 2013-05-28 ENCOUNTER — Telehealth: Payer: Self-pay | Admitting: Neurology

## 2013-05-28 NOTE — Telephone Encounter (Signed)
Called patient to schedule appt per Dr Terrace Arabia, she already has an appt with Dr applegate @11 -06-14, she stated if that don't work out she will call us back.

## 2013-06-25 ENCOUNTER — Telehealth: Payer: Self-pay | Admitting: Neurology

## 2013-06-25 NOTE — Telephone Encounter (Signed)
called former Dr love patient to schedule appt with Dr Terrace Arabia, couldn't reach patient,left message to call back.

## 2013-07-28 ENCOUNTER — Telehealth: Payer: Self-pay | Admitting: Neurology

## 2013-07-28 NOTE — Telephone Encounter (Signed)
Patient called and states is former Dr. Sandria Manly patient, wants to be seen. Please call the patient to schedule.

## 2013-07-29 NOTE — Telephone Encounter (Signed)
Pt requesting appointment with Dr. Terrace Arabia, her earliest available appointment is 08/25/13, pt stated she needed something earlier than that, please advise

## 2013-08-05 NOTE — Telephone Encounter (Signed)
Speak to billing  Office.

## 2013-08-05 NOTE — Telephone Encounter (Signed)
Called patient and left her a message stating she needs to call and ask to speak to billing balance of $ 35.00 dollars needs to be paid and we  will schedule her for Follow up.

## 2013-08-13 NOTE — Telephone Encounter (Signed)
Patient has not called back

## 2014-03-23 IMAGING — CR DG CHEST 2V
2 series · 2 of 2 positions shown · non-contrast
Comparison: PA and lateral chest 09/06/2009 single view of the
chest 05/29/2012.

CLINICAL DATA: Chest pain and cough.

CHEST - 2 VIEW

[w chest pa]
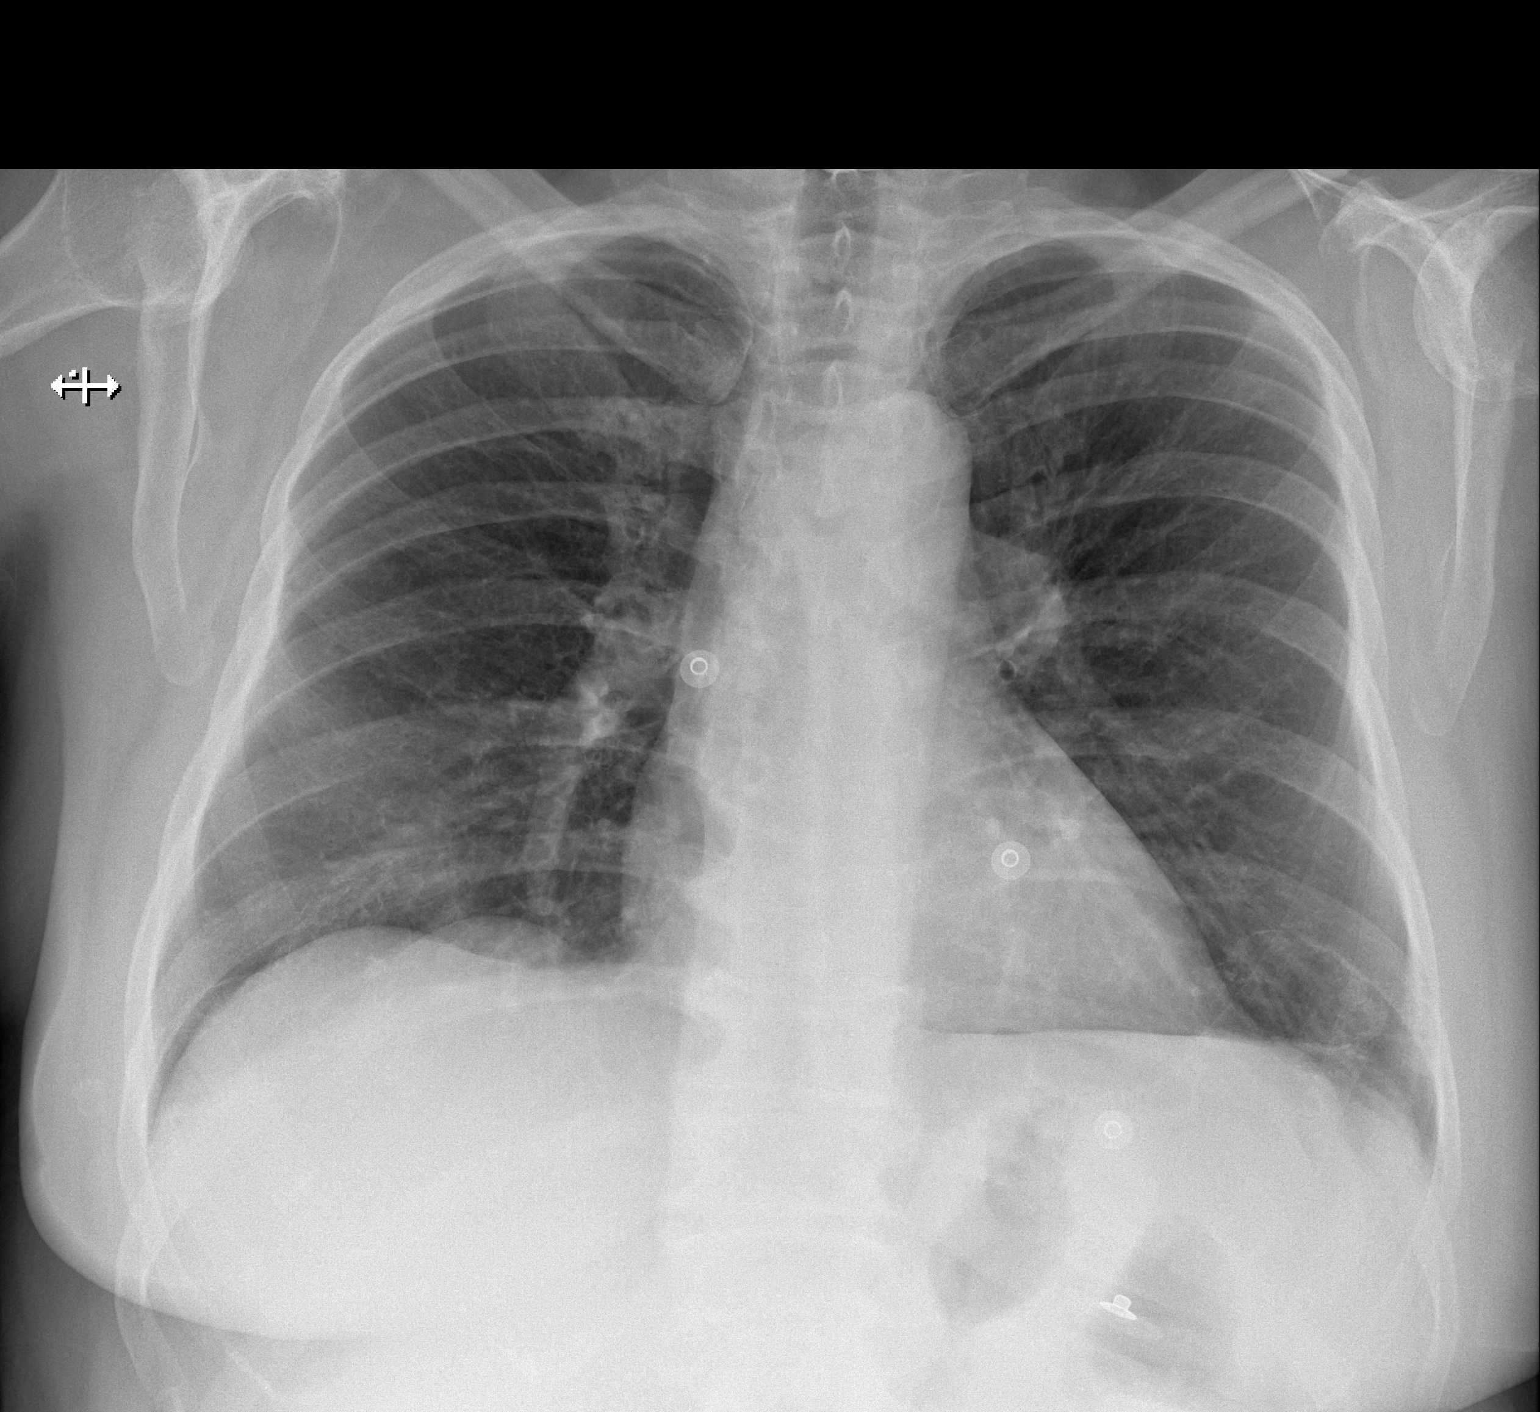

[w chest lat]
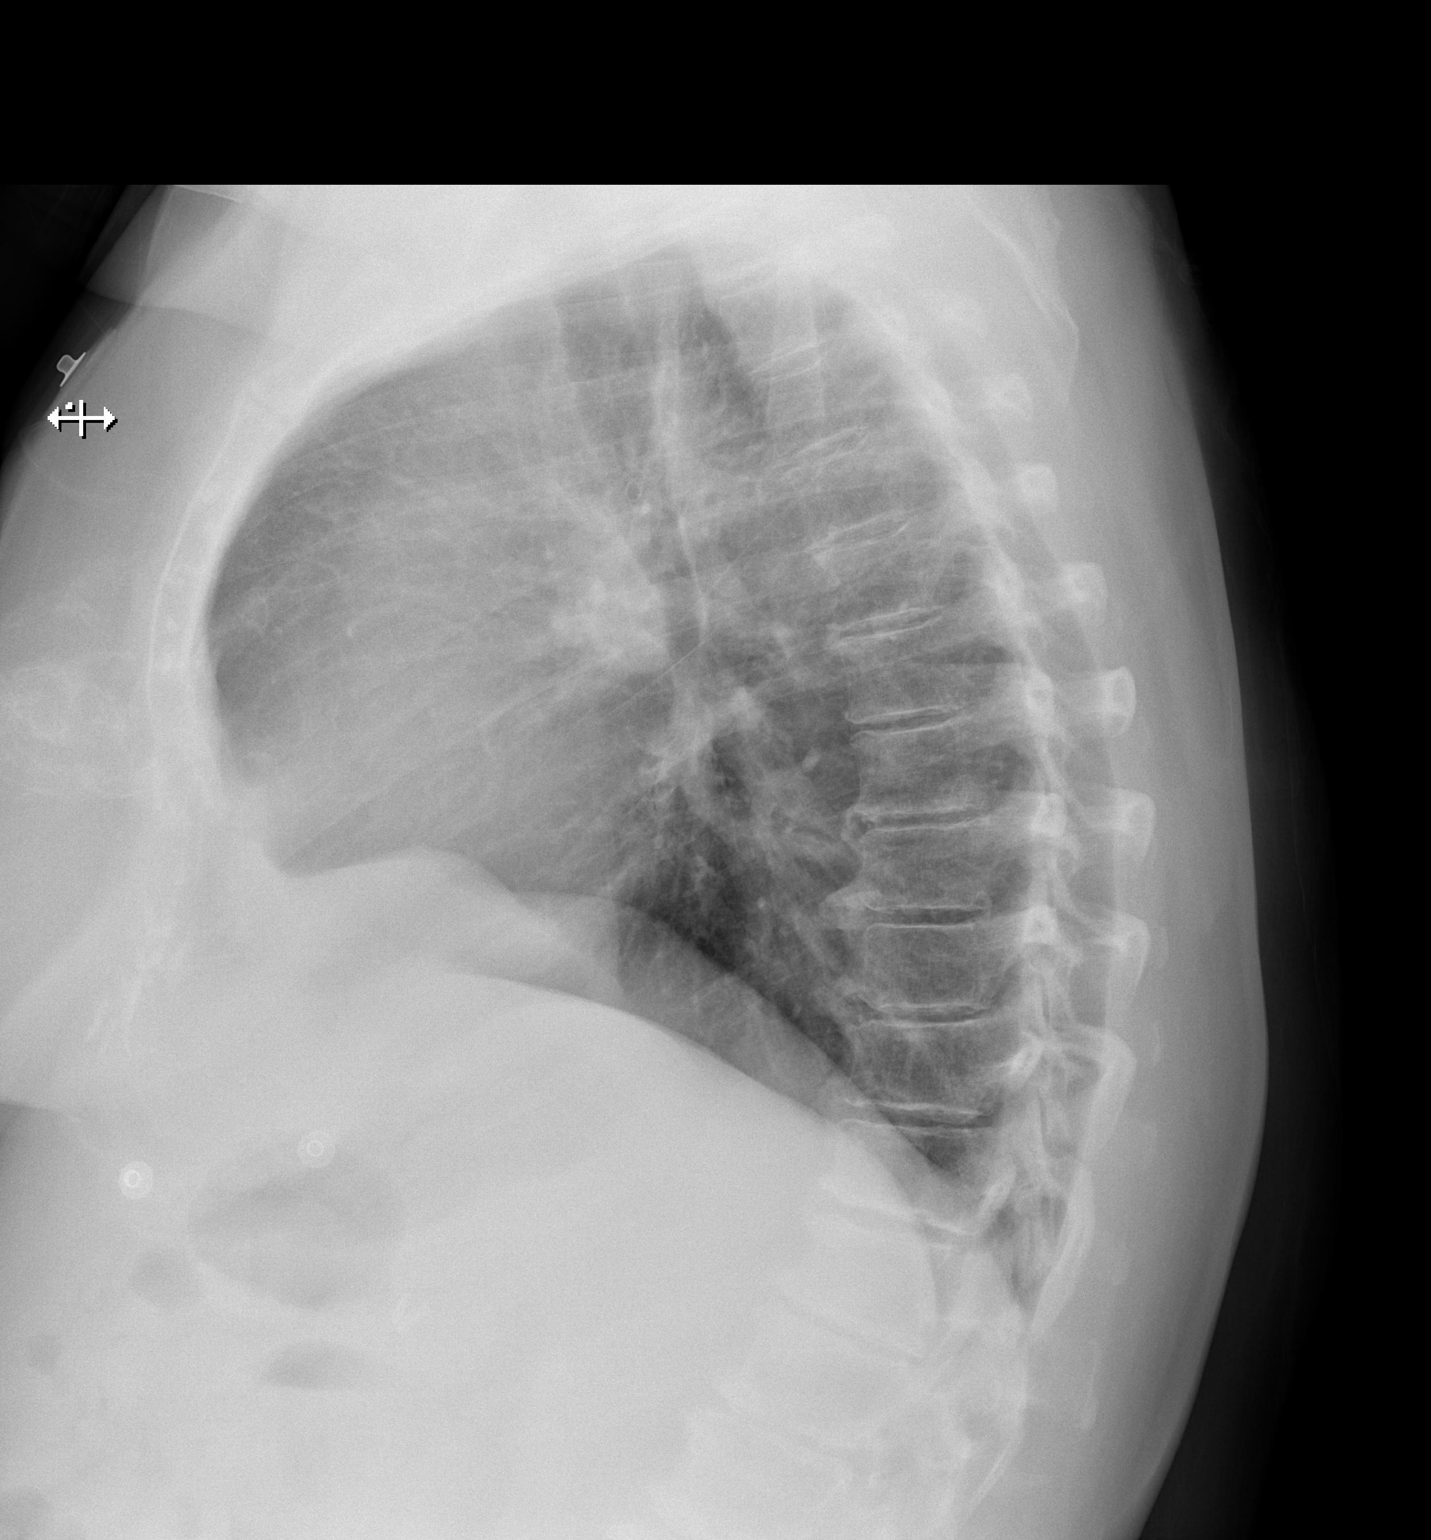

[2 of 2 positions shown; findings below may reference images not displayed]

FINDINGS: Mild scar in the periphery of the left lung base is
unchanged.  The lungs are otherwise clear.  Heart size is normal.
No pneumothorax or pleural fluid.
IMPRESSION: No acute disease.

## 2015-03-25 ENCOUNTER — Other Ambulatory Visit: Payer: Self-pay | Admitting: Rehabilitation

## 2015-03-25 DIAGNOSIS — M47817 Spondylosis without myelopathy or radiculopathy, lumbosacral region: Secondary | ICD-10-CM

## 2015-04-08 ENCOUNTER — Ambulatory Visit
Admission: RE | Admit: 2015-04-08 | Discharge: 2015-04-08 | Disposition: A | Discharge status: Home / Self Care | Payer: Medicare Other | Source: Ambulatory Visit | Attending: Rehabilitation | Admitting: Rehabilitation

## 2015-04-08 DIAGNOSIS — M47817 Spondylosis without myelopathy or radiculopathy, lumbosacral region: Secondary | ICD-10-CM

## 2015-04-08 MED ORDER — GADOBENATE DIMEGLUMINE 529 MG/ML IV SOLN
13.0000 mL | Freq: Once | INTRAVENOUS | Status: AC | PRN
  Administered 2015-04-08: 13 mL via INTRAVENOUS

## 2015-08-01 HISTORY — PX: LUMBAR DISC SURGERY: SHX700

## 2015-08-23 ENCOUNTER — Other Ambulatory Visit: Payer: Self-pay | Admitting: Specialist

## 2015-08-23 DIAGNOSIS — R269 Unspecified abnormalities of gait and mobility: Secondary | ICD-10-CM

## 2015-09-02 ENCOUNTER — Inpatient Hospital Stay: Admission: RE | Admit: 2015-09-02 | Payer: Medicare Other | Source: Ambulatory Visit

## 2015-09-06 ENCOUNTER — Ambulatory Visit: Payer: Medicare Other | Admitting: Neurology

## 2015-09-06 ENCOUNTER — Telehealth: Payer: Self-pay

## 2015-09-06 NOTE — Telephone Encounter (Signed)
Patient did not come to a NP appointment today.  

## 2015-09-08 ENCOUNTER — Encounter: Payer: Self-pay | Admitting: Neurology

## 2015-09-09 ENCOUNTER — Encounter: Payer: Self-pay | Admitting: Neurology

## 2015-09-09 ENCOUNTER — Ambulatory Visit
Admission: RE | Admit: 2015-09-09 | Discharge: 2015-09-09 | Disposition: A | Discharge status: Home / Self Care | Payer: Medicare Other | Source: Ambulatory Visit | Attending: Specialist | Admitting: Specialist

## 2015-09-09 ENCOUNTER — Ambulatory Visit (INDEPENDENT_AMBULATORY_CARE_PROVIDER_SITE_OTHER): Payer: Medicare Other | Admitting: Neurology

## 2015-09-09 ENCOUNTER — Telehealth: Payer: Self-pay | Admitting: Neurology

## 2015-09-09 VITALS — BP 152/68 | HR 44 | Ht 63.0 in | Wt 159.5 lb

## 2015-09-09 DIAGNOSIS — R251 Tremor, unspecified: Secondary | ICD-10-CM | POA: Diagnosis not present

## 2015-09-09 DIAGNOSIS — G2581 Restless legs syndrome: Secondary | ICD-10-CM

## 2015-09-09 DIAGNOSIS — G20 Parkinson's disease: Secondary | ICD-10-CM

## 2015-09-09 DIAGNOSIS — G20C Parkinsonism, unspecified: Secondary | ICD-10-CM

## 2015-09-09 DIAGNOSIS — G2119 Other drug induced secondary parkinsonism: Secondary | ICD-10-CM | POA: Diagnosis not present

## 2015-09-09 DIAGNOSIS — R269 Unspecified abnormalities of gait and mobility: Secondary | ICD-10-CM

## 2015-09-09 HISTORY — DX: Parkinson's disease: G20

## 2015-09-09 HISTORY — DX: Tremor, unspecified: R25.1

## 2015-09-09 HISTORY — DX: Parkinsonism, unspecified: G20.C

## 2015-09-09 HISTORY — DX: Restless legs syndrome: G25.81

## 2015-09-09 MED ORDER — GADOBENATE DIMEGLUMINE 529 MG/ML IV SOLN
15.0000 mL | Freq: Once | INTRAVENOUS | Status: AC | PRN
  Administered 2015-09-09: 15 mL via INTRAVENOUS

## 2015-09-09 NOTE — Telephone Encounter (Signed)
   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   MRI brain 09/09/15:  IMPRESSION: No acute abnormality.  Moderate chronic microvascular ischemic changes are stable since 2014.

## 2015-09-09 NOTE — Patient Instructions (Addendum)
   Would consider stopping the metoclopramide 10 mg tablet if possible.    Tremor A tremor is trembling or shaking that you cannot control. Most tremors affect the hands or arms. Tremors can also affect the head, vocal cords, face, and other parts of the body. There are many types of tremors. Common types include:   Essential tremor. These usually occur in people over the age of 73. It may run in families and can happen in otherwise healthy people.   Resting tremor. These occur when the muscles are at rest, such as when your hands are resting in your lap. People with Parkinson disease often have resting tremors.   Postural tremor. These occur when you try to hold a pose, such as keeping your hands outstretched.   Kinetic tremor. These occur during purposeful movement, such as trying to touch a finger to your nose.   Task-specific tremor. These may occur when you perform tasks such as handwriting, speaking, or standing.   Psychogenic tremor. These dramatically lessen or disappear when you are distracted. They can happen in people of all ages.  Some types of tremors have no known cause. Tremors can also be a symptom of nervous system problems (neurological disorders) that may occur with aging. Some tremors go away with treatment while others do not.  HOME CARE INSTRUCTIONS Watch your tremor for any changes. The following actions may help to lessen any discomfort you are feeling:  Take medicines only as directed by your health care provider.   Limit alcohol intake to no more than 1 drink per day for nonpregnant women and 2 drinks per day for men. One drink equals 12 oz of beer, 5 oz of wine, or 1 oz of hard liquor.  Do not use any tobacco products, including cigarettes, chewing tobacco, or electronic cigarettes. If you need help quitting, ask your health care provider.   Avoid extreme heat or cold.   Limit the amount of caffeine you consumeas directed by your health care  provider.   Try to get 8 hours of sleep each night.  Find ways to manage your stress, such as meditation or yoga.  Keep all follow-up visits as directed by your health care provider. This is important. SEEK MEDICAL CARE IF:  You start having a tremor after starting a new medicine.  You have tremor with other symptoms such as:  Numbness.  Tingling.  Pain.  Weakness.  Your tremor gets worse.  Your tremor interferes with your day-to-day life.   This information is not intended to replace advice given to you by your health care provider. Make sure you discuss any questions you have with your health care provider.   Document Released: 07/07/2002 Document Revised: 08/07/2014 Document Reviewed: 01/12/2014 Elsevier Interactive Patient Education [employer]

## 2015-09-09 NOTE — Progress Notes (Signed)
Reason for visit: Tremor  Referring physician: Dr. Sherlynn Carbon is a 70 y.o. female  History of present illness:  Kayla Horne is a 70 year old right-handed white female with a history of a tremor that is affecting primarily the upper extremities, right slightly more than the left over the last 3-4 years. About 6 months ago, the patient began noting she felt the tremor throughout the body including the head and neck and the body itself. She has also noted that there has been a change in her ability to ambulate. She is shuffling her feet, she has fallen on 2 occasions. She is not having any difficulty with speech or swallowing, she denies drooling. She has not had any freezing episodes. She does have restless leg syndrome, and she takes medication for this. She denies issues controlling the bowels or the bladder. She does note some numbness of the hands, but not of the lower extremities. She denies any weakness of the extremities. She does have a history of diabetes. Her primary doctor has set her up for MRI evaluation of the brain which will be done today. She is sent to this office for an evaluation. It appears that she is on metoclopramide, the patient does not recall how long she has been on this drug, but she believes that she has been on it at least 6 months. She denies any family history of tremor.  Past Medical History  Diagnosis Date  . DEGENERATIVE DISC DISEASE 10/12/2008    Qualifier: Diagnosis of  By: Maple Hudson MD, Clinton D   . DIABETES, TYPE 2 02/16/2008    Qualifier: Diagnosis of  By: Maple Hudson MD, Clinton D   . Esophageal reflux 02/11/2008    Centricity Description: GASTROESOPHAGEAL REFLUX DISEASE, CHRONIC Qualifier: Diagnosis of  By: Maple Hudson MD, Rennis Chris  Centricity Description: Gean Maidens D Qualifier: Diagnosis of  By: Maple Hudson MD, Clinton D   . Hemoptysis 10/12/2008    Qualifier: Diagnosis of  By: Maple Hudson MD, Clinton D   . Anxiety   . Depression   . Hypothyroidism   .  Hypertension   . Tremor 09/09/2015  . Parkinsonism (HCC) 09/09/2015  . Restless leg syndrome 09/09/2015    Past Surgical History  Procedure Laterality Date  . Abdominal hysterectomy    . Knee surgery      Arthroscopic right  . Cervical spine surgery    . Breast reduction surgery    . Lumbar disc surgery    . Cholecystectomy      Family History  Problem Relation Age of Onset  . Stroke Father 36  . CAD Father   . CAD Brother 55    CABG  . CAD Brother 74    CABG  . Restless legs syndrome Mother   . Tremor Neg Hx     Social history:  reports that she has been smoking Cigarettes.  She has been smoking about 0.25 packs per day. She has never used smokeless tobacco. She reports that she does not drink alcohol or use illicit drugs.  Medications:  Prior to Admission medications   Medication Sig Start Date End Date Taking? Authorizing Provider  ALPRAZolam Prudy Feeler) 1 MG tablet Take 0.5-1 mg by mouth 3 (three) times daily as needed. For anxiety   Yes Historical Provider, MD  citalopram (CELEXA) 20 MG tablet Take 20 mg by mouth daily.   Yes Historical Provider, MD  furosemide (LASIX) 40 MG tablet Take 40 mg by mouth daily.   Yes  Historical Provider, MD  insulin NPH-regular Human (NOVOLIN 70/30) (70-30) 100 UNIT/ML injection Inject into the skin.   Yes Historical Provider, MD  levothyroxine (SYNTHROID, LEVOTHROID) 112 MCG tablet Take 112 mcg by mouth daily.   Yes Historical Provider, MD  metoCLOPramide (REGLAN) 10 MG tablet Take 10 mg by mouth 2 (two) times daily.   Yes Historical Provider, MD  metoprolol succinate (TOPROL-XL) 50 MG 24 hr tablet Take 50 mg by mouth daily. Take with or immediately following a meal.   Yes Historical Provider, MD  nitroGLYCERIN (NITROSTAT) 0.4 MG SL tablet Place 0.4 mg under the tongue every 5 (five) minutes as needed.   Yes Historical Provider, MD  oxycodone (OXY-IR) 5 MG capsule Take 5 mg by mouth every 6 (six) hours as needed.   Yes Historical Provider, MD    pantoprazole (PROTONIX) 40 MG tablet Take 40 mg by mouth daily.   Yes Historical Provider, MD  pramipexole (MIRAPEX) 0.5 MG tablet Take 0.5 mg by mouth at bedtime.   Yes Historical Provider, MD  tizanidine (ZANAFLEX) 2 MG capsule Take 2 mg by mouth 3 (three) times daily as needed for muscle spasms.   Yes Historical Provider, MD      Allergies  Allergen Reactions  . Latex   . Morphine     ROS:  Out of a complete 14 system review of symptoms, the patient complains only of the following symptoms, and all other reviewed systems are negative.  Cough, snoring Moles Increased thirst Joint pain, achy muscles Memory loss, confusion, headache, weakness, tremor Depression, anxiety, decreased energy, change in appetite Sleepiness, snoring, restless legs  Blood pressure 152/68, pulse 44, height  (1.6 m), weight 159 lb 8 oz (72.349 kg).  Physical Exam  General: The patient is alert and cooperative at the time of the examination. The patient is moderately obese.  Eyes: Pupils are equal, round, and reactive to light. Discs are flat bilaterally.  Neck: The neck is supple, no carotid bruits are noted.  Respiratory: The respiratory examination is clear.  Cardiovascular: The cardiovascular examination reveals a regular rate and rhythm, no obvious murmurs or rubs are noted.  Skin: Extremities are without significant edema.  Neurologic Exam  Mental status: The patient is alert and oriented x 3 at the time of the examination. The patient has apparent normal recent and remote memory, with an apparently normal attention span and concentration ability.  Cranial nerves: Facial symmetry is present. Masking of the face is seen. There is good sensation of the face to pinprick and soft touch bilaterally. The strength of the facial muscles and the muscles to head turning and shoulder shrug are normal bilaterally. Speech is well enunciated, no aphasia or dysarthria is noted. Extraocular movements  are full. Visual fields are full. The tongue is midline, and the patient has symmetric elevation of the soft palate. No obvious hearing deficits are noted.  Motor: The motor testing reveals 5 over 5 strength of all 4 extremities. Good symmetric motor tone is noted throughout.  Sensory: Sensory testing is intact to pinprick, soft touch, vibration sensation, and position sense on all 4 extremities. No stocking pattern pinprick sensory deficit was noted in the legs. No evidence of extinction is noted.  Coordination: Cerebellar testing reveals good finger-nose-finger and heel-to-shin bilaterally. The patient appears to have an intention tremor with both upper extremities, the right is minimally more than the left. There is a side-to-side head tremor as well. No significant resting tremor is seen.  Gait and station:  Gait is normal, with exception that there is decreased arm swing bilaterally, minimal tremor noted while walking. Tandem gait is slightly unsteady. Romberg is negative. No drift is seen.  Reflexes: Deep tendon reflexes are symmetric and normal bilaterally. The ankle jerk reflexes are well-maintained. Toes are downgoing bilaterally.   Assessment/Plan:  1. Probable essential tremor  2. 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 Restless leg syndrome  The patient has been on metoclopramide which may produce Parkinson's symptoms. I have recommended that if possible she should taper off of this medication, watching out for tardive movement disorder issues developing. I suspect that the patient has an underlying essential tremor that will not improve with withdrawal of the metoclopramide. The metoclopramide will also worsen the restless leg syndrome. The patient will follow-up through this office in 4 months. We will try to follow-up with the MRI brain results.  Marlan Palau MD 09/09/2015 6:58 PM  [facility] [address] [place], [place]  [address]  Phone [phone] Fax [phone]

## 2015-09-16 DIAGNOSIS — E785 Hyperlipidemia, unspecified: Secondary | ICD-10-CM

## 2015-09-16 DIAGNOSIS — F339 Major depressive disorder, recurrent, unspecified: Secondary | ICD-10-CM

## 2015-09-16 DIAGNOSIS — Z79899 Other long term (current) drug therapy: Secondary | ICD-10-CM

## 2015-09-16 DIAGNOSIS — M797 Fibromyalgia: Secondary | ICD-10-CM | POA: Insufficient documentation

## 2015-09-16 DIAGNOSIS — G250 Essential tremor: Secondary | ICD-10-CM

## 2015-09-16 DIAGNOSIS — M1990 Unspecified osteoarthritis, unspecified site: Secondary | ICD-10-CM

## 2015-09-16 DIAGNOSIS — M792 Neuralgia and neuritis, unspecified: Secondary | ICD-10-CM | POA: Insufficient documentation

## 2015-09-16 DIAGNOSIS — G629 Polyneuropathy, unspecified: Secondary | ICD-10-CM

## 2015-09-16 DIAGNOSIS — M2010 Hallux valgus (acquired), unspecified foot: Secondary | ICD-10-CM

## 2015-09-16 DIAGNOSIS — Z6836 Body mass index (BMI) 36.0-36.9, adult: Secondary | ICD-10-CM

## 2015-09-16 DIAGNOSIS — F419 Anxiety disorder, unspecified: Secondary | ICD-10-CM

## 2015-09-16 DIAGNOSIS — E66812 Obesity, class 2: Secondary | ICD-10-CM | POA: Insufficient documentation

## 2015-09-16 DIAGNOSIS — E039 Hypothyroidism, unspecified: Secondary | ICD-10-CM

## 2015-09-16 DIAGNOSIS — E669 Obesity, unspecified: Secondary | ICD-10-CM

## 2015-09-16 DIAGNOSIS — I10 Essential (primary) hypertension: Secondary | ICD-10-CM

## 2015-09-16 DIAGNOSIS — R269 Unspecified abnormalities of gait and mobility: Secondary | ICD-10-CM

## 2015-09-16 DIAGNOSIS — M791 Myalgia, unspecified site: Secondary | ICD-10-CM

## 2015-09-16 HISTORY — DX: Hypothyroidism, unspecified: E03.9

## 2015-09-16 HISTORY — DX: Essential tremor: G25.0

## 2015-09-16 HISTORY — DX: Anxiety disorder, unspecified: F41.9

## 2015-09-16 HISTORY — DX: Major depressive disorder, recurrent, unspecified: F33.9

## 2015-09-16 HISTORY — DX: Essential (primary) hypertension: I10

## 2015-09-16 HISTORY — DX: Hallux valgus (acquired), unspecified foot: M20.10

## 2015-09-16 HISTORY — DX: Hyperlipidemia, unspecified: E78.5

## 2015-09-16 HISTORY — DX: Morbid (severe) obesity due to excess calories: E66.01

## 2015-09-16 HISTORY — DX: Obesity, unspecified: E66.9

## 2015-09-16 HISTORY — DX: Unspecified abnormalities of gait and mobility: R26.9

## 2015-09-16 HISTORY — DX: Body mass index (BMI) 36.0-36.9, adult: Z68.36

## 2015-09-16 HISTORY — DX: Myalgia, unspecified site: M79.10

## 2015-09-16 HISTORY — DX: Polyneuropathy, unspecified: G62.9

## 2015-09-16 HISTORY — DX: Unspecified osteoarthritis, unspecified site: M19.90

## 2015-09-16 HISTORY — DX: Fibromyalgia: M79.7

## 2015-09-16 HISTORY — DX: Other long term (current) drug therapy: Z79.899

## 2015-09-17 DIAGNOSIS — N183 Chronic kidney disease, stage 3 unspecified: Secondary | ICD-10-CM | POA: Insufficient documentation

## 2015-09-17 DIAGNOSIS — E1122 Type 2 diabetes mellitus with diabetic chronic kidney disease: Secondary | ICD-10-CM | POA: Insufficient documentation

## 2015-09-17 DIAGNOSIS — Z8673 Personal history of transient ischemic attack (TIA), and cerebral infarction without residual deficits: Secondary | ICD-10-CM

## 2015-09-17 DIAGNOSIS — Z794 Long term (current) use of insulin: Secondary | ICD-10-CM | POA: Insufficient documentation

## 2015-09-17 HISTORY — DX: Chronic kidney disease, stage 3 unspecified: N18.30

## 2015-09-17 HISTORY — DX: Type 2 diabetes mellitus with diabetic chronic kidney disease: E11.22

## 2015-09-17 HISTORY — DX: Long term (current) use of insulin: Z79.4

## 2015-09-17 HISTORY — DX: Personal history of transient ischemic attack (TIA), and cerebral infarction without residual deficits: Z86.73

## 2016-01-20 ENCOUNTER — Ambulatory Visit: Payer: Medicare Other | Admitting: Neurology

## 2016-01-20 ENCOUNTER — Telehealth: Payer: Self-pay

## 2016-01-20 NOTE — Telephone Encounter (Signed)
Pt no-showed f/u appt this morning.

## 2016-01-21 ENCOUNTER — Encounter: Payer: Self-pay | Admitting: Neurology

## 2016-09-06 DIAGNOSIS — S0101XA Laceration without foreign body of scalp, initial encounter: Secondary | ICD-10-CM

## 2016-09-06 DIAGNOSIS — R221 Localized swelling, mass and lump, neck: Secondary | ICD-10-CM

## 2016-09-06 HISTORY — DX: Localized swelling, mass and lump, neck: R22.1

## 2016-09-06 HISTORY — DX: Laceration without foreign body of scalp, initial encounter: S01.01XA

## 2016-11-07 ENCOUNTER — Ambulatory Visit (INDEPENDENT_AMBULATORY_CARE_PROVIDER_SITE_OTHER): Payer: Medicare Other | Admitting: Nurse Practitioner

## 2016-11-07 ENCOUNTER — Encounter (INDEPENDENT_AMBULATORY_CARE_PROVIDER_SITE_OTHER): Payer: Self-pay

## 2016-11-07 ENCOUNTER — Encounter: Payer: Self-pay | Admitting: Nurse Practitioner

## 2016-11-07 VITALS — BP 104/69 | HR 48 | Wt 171.4 lb

## 2016-11-07 DIAGNOSIS — R251 Tremor, unspecified: Secondary | ICD-10-CM | POA: Diagnosis not present

## 2016-11-07 DIAGNOSIS — G2581 Restless legs syndrome: Secondary | ICD-10-CM

## 2016-11-07 DIAGNOSIS — G2119 Other drug induced secondary parkinsonism: Secondary | ICD-10-CM | POA: Diagnosis not present

## 2016-11-07 MED ORDER — PRIMIDONE 50 MG PO TABS: 50.0000 mg | ORAL_TABLET | Freq: Every day | ORAL | 6 refills | Status: DC

## 2016-11-07 NOTE — Progress Notes (Signed)
I have read the note, and I agree with the clinical assessment and plan.  Kayla Horne   

## 2016-11-07 NOTE — Progress Notes (Signed)
[facility]  PATIENT: Kayla Horne DOB: May 04, 1946   REASON FOR VISIT: Follow-up for tremor HISTORY FROM: Patient and son    HISTORY OF PRESENT ILLNESS:UPDATE 04/10/2018CM Kayla Horne, 71 year old female returns for follow-up with a history of tremor that primarily affects the upper extremities, and mild side-to-side head tremor. This does not interfere with her speech or swallowing she has not had any drooling. She has not had falls. She does have a history of restless legs and she takes Mirapex. She has not had any issues with controlling bowel or bladder she has a history of diabetes and says her fasting sugars every morning are between 119-160. MRI of the brain 09/09/2015 shows mild to moderate chronic SVD changes are stable since 2014 and mild atrophy no acute abnormality. She had been on Metoclopramide for approximately a year. According to her pharmacy she stopped that  about 6 months ago  She had a spinal fusion in March of last year. She returns for reevaluation  HISTORY 09/09/15 KWMs. Kayla Horne is a 71 year old right-handed white female with a history of a tremor that is affecting primarily the upper extremities, right slightly more than the left over the last 3-4 years. About 6 months ago, the patient began noting she felt the tremor throughout the body including the head and neck and the body itself. She has also noted that there has been a change in her ability to ambulate. She is shuffling her feet, she has fallen on 2 occasions. She is not having any difficulty with speech or swallowing, she denies drooling. She has not had any freezing episodes. She does have restless leg syndrome, and she takes medication for this. She denies issues controlling the bowels or the bladder. She does note some numbness of the hands, but not of the lower extremities. She denies any weakness of the extremities. She does have a history of diabetes. Her primary doctor has set  her up for MRI evaluation of the brain which will be done today. She is sent to this office for an evaluation. It appears that she is on metoclopramide, the patient does not recall how long she has been on this drug, but she believes that she has been on it at least 6 months. She denies any family history of tremor.   REVIEW OF SYSTEMS: Full 14 system review of systems performed and notable only for those listed, all others are neg:  Constitutional: neg  Cardiovascular: neg Ear/Nose/Throat: neg  Skin: neg Eyes: neg Respiratory: neg Gastroitestinal: neg  Hematology/Lymphatic: neg  Endocrine: neg Musculoskeletal: Walking difficulty Allergy/Immunology: neg Neurological: Tremors  Psychiatric: Depression and anxiety Sleep : Restless legs   ALLERGIES: Allergies  Allergen Reactions  . Aspirin Other (See Comments)    dyspepsia  . Codeine Sulfate Nausea Only  . Morphine Nausea And Vomiting  . Latex Rash  . Tape Rash    HOME MEDICATIONS: Outpatient Medications Prior to Visit  Medication Sig Dispense Refill  . ALPRAZolam (XANAX) 1 MG tablet Take 0.5-1 mg by mouth 3 (three) times daily as needed. For anxiety    . citalopram (CELEXA) 20 MG tablet Take 20 mg by mouth daily.    . furosemide (LASIX) 40 MG tablet Take 40 mg by mouth daily.    . insulin NPH-regular Human (NOVOLIN 70/30) (70-30) 100 UNIT/ML injection Inject into the skin.    Marland Kitchen levothyroxine (SYNTHROID, LEVOTHROID) 112 MCG tablet Take 112 mcg by mouth daily.    . metoCLOPramide (REGLAN) 10 MG tablet Take  10 mg by mouth 2 (two) times daily.    . metoprolol succinate (TOPROL-XL) 50 MG 24 hr tablet Take 50 mg by mouth daily. Take with or immediately following a meal.    . nitroGLYCERIN (NITROSTAT) 0.4 MG SL tablet Place 0.4 mg under the tongue every 5 (five) minutes as needed.    . pantoprazole (PROTONIX) 40 MG tablet Take 40 mg by mouth daily.    . pramipexole (MIRAPEX) 0.5 MG tablet Take 0.5 mg by mouth at bedtime.    Marland Kitchen  oxycodone (OXY-IR) 5 MG capsule Take 5 mg by mouth every 6 (six) hours as needed.    . tizanidine (ZANAFLEX) 2 MG capsule Take 2 mg by mouth 3 (three) times daily as needed for muscle spasms.     No facility-administered medications prior to visit.     PAST MEDICAL HISTORY: Past Medical History:  Diagnosis Date  . Anxiety   . DEGENERATIVE DISC DISEASE 10/12/2008   Qualifier: Diagnosis of  By: Maple Hudson MD, Clinton D   . Depression   . DIABETES, TYPE 2 02/16/2008   Qualifier: Diagnosis of  By: Maple Hudson MD, Clinton D   . Esophageal reflux 02/11/2008   Centricity Description: GASTROESOPHAGEAL REFLUX DISEASE, CHRONIC Qualifier: Diagnosis of  By: Maple Hudson MD, Rennis Chris  Centricity Description: Gean Maidens D Qualifier: Diagnosis of  By: Maple Hudson MD, Clinton D   . Hemoptysis 10/12/2008   Qualifier: Diagnosis of  By: Maple Hudson MD, Clinton D   . Hypertension   . Hypothyroidism   . Parkinsonism (HCC) 09/09/2015  . Restless leg syndrome 09/09/2015  . Tremor 09/09/2015    PAST SURGICAL HISTORY: Past Surgical History:  Procedure Laterality Date  . ABDOMINAL HYSTERECTOMY    . BREAST REDUCTION SURGERY    . CERVICAL SPINE SURGERY    . CHOLECYSTECTOMY    . KNEE SURGERY     Arthroscopic right  . LUMBAR DISC SURGERY  2017   x 2    FAMILY HISTORY: Family History  Problem Relation Age of Onset  . Stroke Father 59  . CAD Father   . CAD Brother 72    CABG  . CAD Brother 72    CABG  . Restless legs syndrome Mother   . Tremor Neg Hx     SOCIAL HISTORY: Social History   Social History  . Marital status: Single    Spouse name: N/A  . Number of children: 1  . Years of education: 69   Occupational History  . retired    Social History Main Topics  . Smoking status: Current Some Day Smoker    Packs/day: 0.25    Types: Cigarettes  . Smokeless tobacco: Never Used     Comment: 5 -7 cigarettes a week  . Alcohol use No  . Drug use: No  . Sexual activity: Not on file   Other Topics Concern  . Not on file    Social History Narrative   Lives with two dogs and two cats.  One adopted son.        Patient does not drink caffeine.   Patient is right handed.      PHYSICAL EXAM  Vitals:   11/07/16 1252  BP: 104/69  Pulse: (!) 48  Weight: 171 lb 6.4 oz (77.7 kg)   Body mass index is 30.36 kg/m.  Generalized: Well developed, Moderately obese female in no acute distress  Head: normocephalic and atraumatic,. Oropharynx benign , decreased facial expression Neck: Supple, no carotid bruits  Cardiac: Regular  rate rhythm, no murmur  Musculoskeletal: No deformity   Neurological examination   Mentation: Alert oriented to time, place, history taking. Attention span and concentration appropriate. Recent and remote memory intact.  Follows all commands speech and language fluent.   Cranial nerve II-XII: Fundoscopic exam reveals sharp disc margins.Pupils were equal round reactive to light extraocular movements were full, visual field were full on confrontational test. Facial sensation and strength were normal. hearing was intact to finger rubbing bilaterally. Uvula tongue midline. head turning and shoulder shrug were normal and symmetric.Tongue protrusion into cheek strength was normal. Motor: normal bulk and tone, full strength in the BUE, BLE, fine finger movements normal, no pronator drift. No focal weakness Sensory: normal and symmetric to light touch, pinprick, and  Vibration, proprioception in all 4 extremities. Coordination: finger-nose-finger, heel-to-shin bilaterally, the patient has an intention tremor with both upper extremities right more than left. There is also a mild side-to-side head tremor, no resting tremor is seen  Reflexes: Brachioradialis 2/2, biceps 2/2, triceps 2/2, patellar 2/2, Achilles 2/2, plantar responses were flexor bilaterally. Gait and Station: Rising up from seated position without assistance, normal stance,  moderate stride, mild decreased arm swing bilaterally, smooth  turning, able to perform tiptoe, and heel walking without difficulty. Tandem gait is mildly unsteady  DIAGNOSTIC DATA (LABS, IMAGING, TESTING) - I reviewed patient records, labs, notes, testing and imaging myself where available.  MRI of the brain 09/09/2015 shows mild to moderate chronic SVD changes are stable since 2014 and mild atrophy no acute abnormality  ASSESSMENT AND PLAN  71 y.o. year old female  has a past medical history of Anxiety;  Parkinsonism (HCC) (09/09/2015); Restless leg syndrome (09/09/2015); and Tremor (09/09/2015). here to follow-up   PLAN: patient is off Metoclopramide which can cause involuntary or uncontrollable movements of the eyes, head, arms and legs,  muscle twitches and spasms. She was on this medication for approximately a year according to her pharmacy Try Primidone for tremor  at night  Follow up 6 months I spent 25 min  in total face to face time with the patient and son more than 50% of which was spent counseling and coordination of care, reviewing test results reviewing medications and discussing and reviewing the diagnosis of parkinsonism, and essential tremor and further treatment options. , Cline Crock, [facility], APRN  [facility] [address] [place], [place] [address] [phone]

## 2016-11-07 NOTE — Patient Instructions (Addendum)
patient is off Metoclopramide which can cause involuntary or uncontrollable movements of the eyes, head, arms and legs, muscle twitches and spasms Try Primidone for tremor  at bedtime Follow up 6 months Essential Tremor A tremor is trembling or shaking that you cannot control. Most tremors affect the hands or arms. Tremors can also affect the head, vocal cords, face, and other parts of the body. Essential tremor is a tremor without a known cause. What are the causes? Essential tremor has no known cause. What increases the risk? You may be at greater risk of essential tremor if:  You have a family member with essential tremor.  You are age 26 or older.  You take certain medicines. What are the signs or symptoms? The main sign of a tremor is uncontrolled and unintentional rhythmic shaking of a body part.  You may have difficulty eating with a spoon or fork.  You may have difficulty writing.  You may nod your head up and down or side to side.  You may have a quivering voice. Your tremors:  May get worse over time.  May come and go.  May be more noticeable on one side of your body.  May get worse due to stress, fatigue, caffeine, and extreme heat or cold. How is this diagnosed? Your health care provider can diagnose essential tremor based on your symptoms, medical history, and a physical examination. There is no single test to diagnose an essential tremor. However, your health care provider may perform a variety of tests to rule out other conditions. Tests may include:  Blood and urine tests.  Imaging studies of your brain, such as:  CT scan.  MRI.  A test that measures involuntary muscle movement (electromyogram). How is this treated? Your tremors may go away without treatment. Mild tremors may not need treatment if they do not affect your day-to-day life. Severe tremors may need to be treated using one or a combination of the following options:  Medicines. This may  include medicine that is injected.  Lifestyle changes.  Physical therapy. Follow these instructions at home:  Take medicines only as directed by your health care provider.  Limit alcohol intake to no more than 1 drink per day for nonpregnant women and 2 drinks per day for men. One drink equals 12 oz of beer, 5 oz of wine, or 1 oz of hard liquor.  Do not use any tobacco products, including cigarettes, chewing tobacco, or electronic cigarettes. If you need help quitting, ask your health care provider.  Take medicines only as directed by your health care provider.  Avoid extreme heat or cold.  Limit the amount of caffeine you consumeas directed by your health care provider.  Try to get eight hours of sleep each night.  Find ways to manage your stress, such as meditation or yoga.  Keep all follow-up visits as directed by your health care provider. This is important. This includes any physical therapy visits. Contact a health care provider if:  You experience any changes in the location or intensity of your tremors.  You start having a tremor after starting a new medicine.  You have tremor with other symptoms such as:  Numbness.  Tingling.  Pain.  Weakness.  Your tremor gets worse.  Your tremor interferes with your daily life. This information is not intended to replace advice given to you by your health care provider. Make sure you discuss any questions you have with your health care provider. Document Released: 08/07/2014 Document Revised:  12/23/2015 Document Reviewed: 01/12/2014 Elsevier Interactive Patient Education  2017 [employer].

## 2016-11-13 DIAGNOSIS — B351 Tinea unguium: Secondary | ICD-10-CM

## 2016-11-13 DIAGNOSIS — L84 Corns and callosities: Secondary | ICD-10-CM | POA: Insufficient documentation

## 2016-11-13 HISTORY — DX: Corns and callosities: L84

## 2016-11-13 HISTORY — DX: Tinea unguium: B35.1

## 2016-11-30 ENCOUNTER — Ambulatory Visit: Payer: Medicare Other | Admitting: Nurse Practitioner

## 2017-03-01 DIAGNOSIS — E559 Vitamin D deficiency, unspecified: Secondary | ICD-10-CM | POA: Insufficient documentation

## 2017-03-01 DIAGNOSIS — E538 Deficiency of other specified B group vitamins: Secondary | ICD-10-CM

## 2017-03-01 HISTORY — DX: Vitamin D deficiency, unspecified: E55.9

## 2017-03-01 HISTORY — DX: Deficiency of other specified B group vitamins: E53.8

## 2017-05-09 ENCOUNTER — Telehealth: Payer: Self-pay | Admitting: Neurology

## 2017-05-09 NOTE — Telephone Encounter (Signed)
Called patient to make her aware that she was on the schedule to follow up with the wrong doctor. I was going to offer two different time slots for the patient for tomorrow with Dr Anne Hahn but patient states that she was unable to make tomorrow's apt anyway. I have rescheduled her for 7:30 am on Jun 18 2017 with Dr Anne Hahn

## 2017-05-10 ENCOUNTER — Ambulatory Visit: Payer: Medicare Other | Admitting: Neurology

## 2017-06-04 ENCOUNTER — Other Ambulatory Visit: Payer: Self-pay | Admitting: Nurse Practitioner

## 2017-06-18 ENCOUNTER — Ambulatory Visit: Payer: Self-pay | Admitting: Neurology

## 2017-09-03 DIAGNOSIS — N3946 Mixed incontinence: Secondary | ICD-10-CM

## 2017-09-03 HISTORY — DX: Mixed incontinence: N39.46

## 2017-09-07 DIAGNOSIS — E039 Hypothyroidism, unspecified: Secondary | ICD-10-CM

## 2017-09-07 DIAGNOSIS — J209 Acute bronchitis, unspecified: Secondary | ICD-10-CM | POA: Diagnosis not present

## 2017-09-07 DIAGNOSIS — I10 Essential (primary) hypertension: Secondary | ICD-10-CM | POA: Diagnosis not present

## 2017-09-07 DIAGNOSIS — E119 Type 2 diabetes mellitus without complications: Secondary | ICD-10-CM | POA: Diagnosis not present

## 2017-09-08 DIAGNOSIS — E119 Type 2 diabetes mellitus without complications: Secondary | ICD-10-CM | POA: Diagnosis not present

## 2017-09-08 DIAGNOSIS — I10 Essential (primary) hypertension: Secondary | ICD-10-CM | POA: Diagnosis not present

## 2017-09-08 DIAGNOSIS — E039 Hypothyroidism, unspecified: Secondary | ICD-10-CM | POA: Diagnosis not present

## 2017-09-08 DIAGNOSIS — J209 Acute bronchitis, unspecified: Secondary | ICD-10-CM | POA: Diagnosis not present

## 2017-09-09 DIAGNOSIS — E119 Type 2 diabetes mellitus without complications: Secondary | ICD-10-CM | POA: Diagnosis not present

## 2017-09-09 DIAGNOSIS — J209 Acute bronchitis, unspecified: Secondary | ICD-10-CM | POA: Diagnosis not present

## 2017-09-09 DIAGNOSIS — I10 Essential (primary) hypertension: Secondary | ICD-10-CM | POA: Diagnosis not present

## 2017-09-09 DIAGNOSIS — E039 Hypothyroidism, unspecified: Secondary | ICD-10-CM | POA: Diagnosis not present

## 2017-10-29 DIAGNOSIS — M6701 Short Achilles tendon (acquired), right ankle: Secondary | ICD-10-CM | POA: Insufficient documentation

## 2017-10-29 DIAGNOSIS — M7742 Metatarsalgia, left foot: Secondary | ICD-10-CM

## 2017-10-29 DIAGNOSIS — M6702 Short Achilles tendon (acquired), left ankle: Secondary | ICD-10-CM | POA: Insufficient documentation

## 2017-10-29 DIAGNOSIS — M7741 Metatarsalgia, right foot: Secondary | ICD-10-CM

## 2017-10-29 HISTORY — DX: Metatarsalgia, right foot: M77.41

## 2017-10-29 HISTORY — DX: Short Achilles tendon (acquired), left ankle: M67.02

## 2017-10-29 HISTORY — DX: Metatarsalgia, left foot: M77.42

## 2017-10-29 HISTORY — DX: Short Achilles tendon (acquired), right ankle: M67.01

## 2017-10-30 ENCOUNTER — Telehealth: Payer: Self-pay | Admitting: Neurology

## 2017-10-30 ENCOUNTER — Ambulatory Visit: Payer: Self-pay | Admitting: Neurology

## 2017-10-30 ENCOUNTER — Encounter: Payer: Self-pay | Admitting: Neurology

## 2017-10-30 NOTE — Telephone Encounter (Signed)
This patient did not show for a revisit appointment today. 

## 2017-12-10 ENCOUNTER — Other Ambulatory Visit: Payer: Self-pay | Admitting: Nurse Practitioner

## 2017-12-11 ENCOUNTER — Other Ambulatory Visit: Payer: Self-pay | Admitting: Nurse Practitioner

## 2017-12-14 ENCOUNTER — Other Ambulatory Visit: Payer: Self-pay | Admitting: Nurse Practitioner

## 2018-01-17 ENCOUNTER — Other Ambulatory Visit: Payer: Self-pay | Admitting: Nurse Practitioner

## 2018-03-18 DIAGNOSIS — R600 Localized edema: Secondary | ICD-10-CM | POA: Insufficient documentation

## 2018-03-18 HISTORY — DX: Localized edema: R60.0

## 2018-04-11 DIAGNOSIS — M25562 Pain in left knee: Secondary | ICD-10-CM | POA: Insufficient documentation

## 2018-04-11 HISTORY — DX: Pain in left knee: M25.562

## 2018-05-03 DIAGNOSIS — J42 Unspecified chronic bronchitis: Secondary | ICD-10-CM | POA: Insufficient documentation

## 2018-05-03 HISTORY — DX: Unspecified chronic bronchitis: J42

## 2018-06-27 DIAGNOSIS — E1122 Type 2 diabetes mellitus with diabetic chronic kidney disease: Secondary | ICD-10-CM | POA: Insufficient documentation

## 2018-06-27 DIAGNOSIS — N1831 Chronic kidney disease, stage 3a: Secondary | ICD-10-CM | POA: Insufficient documentation

## 2018-06-27 HISTORY — DX: Chronic kidney disease, stage 3a: N18.31

## 2018-09-05 DIAGNOSIS — M19011 Primary osteoarthritis, right shoulder: Secondary | ICD-10-CM | POA: Insufficient documentation

## 2018-09-05 HISTORY — DX: Primary osteoarthritis, right shoulder: M19.011

## 2018-09-27 ENCOUNTER — Encounter: Payer: Self-pay | Admitting: Sports Medicine

## 2018-09-27 ENCOUNTER — Ambulatory Visit: Payer: Medicare HMO | Admitting: Sports Medicine

## 2018-09-27 ENCOUNTER — Other Ambulatory Visit: Payer: Self-pay

## 2018-09-27 VITALS — BP 118/62 | HR 53 | Resp 16 | Ht 61.0 in | Wt 183.0 lb

## 2018-09-27 DIAGNOSIS — M79674 Pain in right toe(s): Secondary | ICD-10-CM

## 2018-09-27 DIAGNOSIS — M79675 Pain in left toe(s): Secondary | ICD-10-CM | POA: Diagnosis not present

## 2018-09-27 DIAGNOSIS — M2041 Other hammer toe(s) (acquired), right foot: Secondary | ICD-10-CM

## 2018-09-27 DIAGNOSIS — B351 Tinea unguium: Secondary | ICD-10-CM

## 2018-09-27 DIAGNOSIS — M2141 Flat foot [pes planus] (acquired), right foot: Secondary | ICD-10-CM

## 2018-09-27 DIAGNOSIS — M7741 Metatarsalgia, right foot: Secondary | ICD-10-CM

## 2018-09-27 DIAGNOSIS — M21619 Bunion of unspecified foot: Secondary | ICD-10-CM

## 2018-09-27 DIAGNOSIS — L84 Corns and callosities: Secondary | ICD-10-CM

## 2018-09-27 DIAGNOSIS — E1142 Type 2 diabetes mellitus with diabetic polyneuropathy: Secondary | ICD-10-CM

## 2018-09-27 DIAGNOSIS — M2042 Other hammer toe(s) (acquired), left foot: Secondary | ICD-10-CM

## 2018-09-27 DIAGNOSIS — M2142 Flat foot [pes planus] (acquired), left foot: Secondary | ICD-10-CM

## 2018-09-27 NOTE — Progress Notes (Signed)
Subjective: Kayla Horne is a 73 y.o. female patient with history of diabetes who presents to office today complaining of painful calluses to toes and long,mildly painful nails  while ambulating in shoes; unable to trim. Patient states that the glucose reading this morning was 158 mg/dl.  Last A1c 7.1 last saw primary care doctor 1 month ago.  Patient denies any new changes in medication or new problems. Patient admits ongoing constant pain in the right foot underneath the plantar ball states that it constantly hurts denies any trauma or injury.  Patient denies any other pedal complaints at this time.  Review of Systems  All other systems reviewed and are negative.   Patient Active Problem List   Diagnosis Date Noted  . Laceration of scalp 09/06/2016  . Neck mass 09/06/2016  . 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 Abnormal gait 09/16/2015  . Acquired hypothyroidism 09/16/2015  . Anxiety disorder 09/16/2015  . Arthritis 09/16/2015  . Essential hypertension 09/16/2015  . Essential tremor 09/16/2015  . Neuralgia 09/16/2015  . Hallux valgus 09/16/2015  . High risk medication use 09/16/2015  . Hyperlipidemia 09/16/2015  . Myalgia 09/16/2015  . Obesity 09/16/2015  . Polyneuropathy 09/16/2015  . Recurrent major depressive disorder (HCC) 09/16/2015  . Tremor 09/09/2015  . Parkinsonism (HCC) 09/09/2015  . Restless leg syndrome 09/09/2015  . DEGENERATIVE DISC DISEASE 10/12/2008  . HEMOPTYSIS 10/12/2008  . DIABETES, TYPE 2 02/16/2008  . Esophageal reflux 02/11/2008  . COUGH 02/11/2008   Current Outpatient Medications on File Prior to Visit  Medication Sig Dispense Refill  . ALPRAZolam (XANAX) 1 MG tablet Take 0.5-1 mg by mouth 3 (three) times daily as needed. For anxiety    . citalopram (CELEXA) 20 MG tablet Take 20 mg by mouth daily.    . furosemide (LASIX) 40 MG tablet Take 40 mg by mouth daily.    . insulin NPH-regular Human (NOVOLIN 70/30)  (70-30) 100 UNIT/ML injection Inject into the skin.    Marland Kitchen levothyroxine (SYNTHROID, LEVOTHROID) 112 MCG tablet Take 112 mcg by mouth daily.    . metoprolol succinate (TOPROL-XL) 50 MG 24 hr tablet Take 50 mg by mouth daily. Take with or immediately following a meal.    . nitroGLYCERIN (NITROSTAT) 0.4 MG SL tablet Place 0.4 mg under the tongue every 5 (five) minutes as needed.    . pantoprazole (PROTONIX) 40 MG tablet Take 40 mg by mouth daily.    . pravastatin (PRAVACHOL) 20 MG tablet 20 mg daily.    . primidone (MYSOLINE) 50 MG tablet TAKE 1 TABLET BY MOUTH ONCE DAILY AT BEDTIME 30 tablet 0   No current facility-administered medications on file prior to visit.    Allergies  Allergen Reactions  . Aspirin Other (See Comments)    dyspepsia  . Codeine Sulfate Nausea Only  . Morphine Nausea And Vomiting  . Latex Rash  . Tape Rash    No results found for this or any previous visit (from the past 2160 hour(s)).  Objective: General: Patient is awake, alert, and oriented x 3 and in no acute distress.  Integument: Skin is warm, dry and supple bilateral. Nails are tender, long, thickened and  dystrophic with subungual debris, consistent with onychomycosis, 1-5 bilateral. No signs of infection.  There is callus noted to the lateral side of the right second toe medial side of the right third toe and medial side of the right fourth toe as well as bilateral medial hallux, remaining integument unremarkable.  Vasculature:  Dorsalis Pedis pulse 1/4 bilateral. Posterior Tibial pulse  1/4 bilateral.  Capillary fill time <3 sec 1-5 bilateral.  Scant hair growth to the level of the digits. Temperature gradient within normal limits.  Mild varicosities present bilateral. No edema present bilateral.   Neurology: The patient has intact sensation measured with a 5.07/10g Semmes Weinstein Monofilament at all pedal sites bilateral . Vibratory sensation diminished bilateral with tuning fork. No Babinski sign  present bilateral.   Musculoskeletal: Asymptomatic bunion hammertoe and flat feet pedal deformities noted bilateral. Muscular strength 5/5 in all lower extremity muscular groups bilateral without pain on range of motion except at right third metatarsal phalangeal joint where there is pain at the plantar surface of the metatarsal head. No tenderness with calf compression bilateral.  Assessment and Plan: Problem List Items Addressed This Visit    None    Visit Diagnoses    Pain due to onychomycosis of toenails of both feet    -  Primary   Callus of foot       Metatarsalgia, right foot       Diabetic polyneuropathy associated with type 2 diabetes mellitus (HCC)       Pes planus of both feet       Bunion       Hammer toes of both feet          -Examined patient. -Discussed with patient need for x-ray to further evaluate right plantar forefoot pain however patient declined this visit -Discussed and educated patient on diabetic foot care, especially with  regards to the vascular, neurological and musculoskeletal systems.  -Stressed the importance of good glycemic control and the detriment of not  controlling glucose levels in relation to the foot. -Mechanically debrided all nails 1-5 bilateral using sterile nail nipper and filed with dremel without incident  -Mechanically debrided calluses bilateral using sterile chisel blade without incident -Dispensed offloading padding for right foot and advised patient if her right foot pain worsens would warrant an x-ray at next visit -[employer] diabetic shoe order form was completed; office to contact primary care for approval / certification;  Office to arrange shoe/insole fitting and dispensing. -Answered all patient questions -Patient to return  in 3 months for at risk foot care or when called for diabetic shoe measurements -Patient advised to call the office if any problems or questions arise in the meantime.  Asencion Islam, DPM

## 2018-09-27 NOTE — Progress Notes (Signed)
   Subjective:    Patient ID: Kayla Horne, female    DOB: 11/13/1945, 73 y.o.   MRN: [id number]  HPI    Review of Systems  All other systems reviewed and are negative.      Objective:   Physical Exam        Assessment & Plan:

## 2018-10-27 DIAGNOSIS — R079 Chest pain, unspecified: Secondary | ICD-10-CM

## 2018-10-27 DIAGNOSIS — E039 Hypothyroidism, unspecified: Secondary | ICD-10-CM

## 2018-10-27 DIAGNOSIS — E119 Type 2 diabetes mellitus without complications: Secondary | ICD-10-CM

## 2018-10-27 DIAGNOSIS — R001 Bradycardia, unspecified: Secondary | ICD-10-CM | POA: Diagnosis not present

## 2018-10-27 DIAGNOSIS — I10 Essential (primary) hypertension: Secondary | ICD-10-CM | POA: Diagnosis not present

## 2018-10-28 DIAGNOSIS — E039 Hypothyroidism, unspecified: Secondary | ICD-10-CM | POA: Diagnosis not present

## 2018-10-28 DIAGNOSIS — I10 Essential (primary) hypertension: Secondary | ICD-10-CM | POA: Diagnosis not present

## 2018-10-28 DIAGNOSIS — R079 Chest pain, unspecified: Secondary | ICD-10-CM

## 2018-10-28 DIAGNOSIS — R001 Bradycardia, unspecified: Secondary | ICD-10-CM | POA: Diagnosis not present

## 2018-11-14 DIAGNOSIS — R1310 Dysphagia, unspecified: Secondary | ICD-10-CM

## 2018-11-14 DIAGNOSIS — R001 Bradycardia, unspecified: Secondary | ICD-10-CM | POA: Insufficient documentation

## 2018-11-14 HISTORY — DX: Bradycardia, unspecified: R00.1

## 2018-11-14 HISTORY — DX: Dysphagia, unspecified: R13.10

## 2018-12-02 ENCOUNTER — Telehealth: Payer: Self-pay | Admitting: *Deleted

## 2018-12-02 NOTE — Telephone Encounter (Signed)
Pt called about making appt with Dr. Tomie China but there is a note in the chart that says not to schedule the pt due to no shows and cancellations. Emailed French Ana about this and she will get back with me and I will call pt back tomorrow.

## 2018-12-04 ENCOUNTER — Telehealth: Payer: Self-pay | Admitting: Cardiology

## 2018-12-04 NOTE — Telephone Encounter (Signed)
Virtual Visit Pre-Appointment Phone Call  "(Name), I am calling you today to discuss your upcoming appointment. We are currently trying to limit exposure to the virus that causes COVID-19 by seeing patients at home rather than in the office."  1. "What is the BEST phone number to call the day of the visit?" - include this in appointment notes  2. Do you have or have access to (through a family member/friend) a smartphone with video capability that we can use for your visit?" a. If yes - list this number in appt notes as cell (if different from BEST phone #) and list the appointment type as a VIDEO visit in appointment notes b. If no - list the appointment type as a PHONE visit in appointment notes  3. Confirm consent - "In the setting of the current Covid19 crisis, you are scheduled for a (phone or video) visit with your provider on (date) at (time).  Just as we do with many in-office visits, in order for you to participate in this visit, we must obtain consent.  If you'd like, I can send this to your mychart (if signed up) or email for you to review.  Otherwise, I can obtain your verbal consent now.  All virtual visits are billed to your insurance company just like a normal visit would be.  By agreeing to a virtual visit, we'd like you to understand that the technology does not allow for your provider to perform an examination, and thus may limit your provider's ability to fully assess your condition. If your provider identifies any concerns that need to be evaluated in person, we will make arrangements to do so.  Finally, though the technology is pretty good, we cannot assure that it will always work on either your or our end, and in the setting of a video visit, we may have to convert it to a phone-only visit.  In either situation, we cannot ensure that we have a secure connection.  Are you willing to proceed?" STAFF: Did the patient verbally acknowledge consent to telehealth visit? Document  YES/NO here: Yes  4. Advise patient to be prepared - "Two hours prior to your appointment, go ahead and check your blood pressure, pulse, oxygen saturation, and your weight (if you have the equipment to check those) and write them all down. When your visit starts, your provider will ask you for this information. If you have an Apple Watch or Kardia device, please plan to have heart rate information ready on the day of your appointment. Please have a pen and paper handy nearby the day of the visit as well."  5. Give patient instructions for MyChart download to smartphone OR Doximity/Doxy.me as below if video visit (depending on what platform provider is using)  6. Inform patient they will receive a phone call 15 minutes prior to their appointment time (may be from unknown caller ID) so they should be prepared to answer    TELEPHONE CALL NOTE  Kayla Horne has been deemed a candidate for a follow-up tele-health visit to limit community exposure during the Covid-19 pandemic. I spoke with the patient via phone to ensure availability of phone/video source, confirm preferred email & phone number, and discuss instructions and expectations.  I reminded Kayla Horne to be prepared with any vital sign and/or heart rhythm information that could potentially be obtained via home monitoring, at the time of her visit. I reminded Kayla Horne to expect a phone call prior to  her visit.  Alvy Beal 12/04/2018 2:11 PM   INSTRUCTIONS FOR DOWNLOADING THE MYCHART APP TO SMARTPHONE  - The patient must first make sure to have activated MyChart and know their login information - If Apple, go to [employer] and type in MyChart in the search bar and download the app. If Android, ask patient to go to [employer] and type in [place] in the search bar and download the app. The app is free but as with any other app downloads, their phone may require them to verify saved payment information or  Apple/Android password.  - The patient will need to then log into the app with their MyChart username and password, and select [facility] as their healthcare provider to link the account. When it is time for your visit, go to the MyChart app, find appointments, and click Begin Video Visit. Be sure to Select Allow for your device to access the Microphone and Camera for your visit. You will then be connected, and your provider will be with you shortly.  **If they have any issues connecting, or need assistance please contact MyChart service desk [phone])**  **If using a computer, in order to ensure the best quality for their visit they will need to use either of the following Internet Browsers: [employer] or Google Chrome**  IF USING DOXIMITY or DOXY.ME - The patient will receive a link just prior to their visit by text.     FULL LENGTH CONSENT FOR TELE-HEALTH VISIT   I hereby voluntarily request, consent and authorize [facility] and its employed or contracted physicians, physician assistants, nurse practitioners or other licensed health care professionals (the Practitioner), to provide me with telemedicine health care services (the Services") as deemed necessary by the treating Practitioner. I acknowledge and consent to receive the Services by the Practitioner via telemedicine. I understand that the telemedicine visit will involve communicating with the Practitioner through live audiovisual communication technology and the disclosure of certain medical information by electronic transmission. I acknowledge that I have been given the opportunity to request an in-person assessment or other available alternative prior to the telemedicine visit and am voluntarily participating in the telemedicine visit.  I understand that I have the right to withhold or withdraw my consent to the use of telemedicine in the course of my care at any time, without affecting my right to future care  or treatment, and that the Practitioner or I may terminate the telemedicine visit at any time. I understand that I have the right to inspect all information obtained and/or recorded in the course of the telemedicine visit and may receive copies of available information for a reasonable fee.  I understand that some of the potential risks of receiving the Services via telemedicine include:   Delay or interruption in medical evaluation due to technological equipment failure or disruption;  Information transmitted may not be sufficient (e.g. poor resolution of images) to allow for appropriate medical decision making by the Practitioner; and/or   In rare instances, security protocols could fail, causing a breach of personal health information.  Furthermore, I acknowledge that it is my responsibility to provide information about my medical history, conditions and care that is complete and accurate to the best of my ability. I acknowledge that Practitioner's advice, recommendations, and/or decision may be based on factors not within their control, such as incomplete or inaccurate data provided by me or distortions of diagnostic images or specimens that may result from electronic transmissions. I  understand that the practice of medicine is not an exact science and that Practitioner makes no warranties or guarantees regarding treatment outcomes. I acknowledge that I will receive a copy of this consent concurrently upon execution via email to the email address I last provided but may also request a printed copy by calling the office of [place].    I understand that my insurance will be billed for this visit.   I have read or had this consent read to me.  I understand the contents of this consent, which adequately explains the benefits and risks of the Services being provided via telemedicine.   I have been provided ample opportunity to ask questions regarding this consent and the Services and have had  my questions answered to my satisfaction.  I give my informed consent for the services to be provided through the use of telemedicine in my medical care  By participating in this telemedicine visit I agree to the above.

## 2018-12-05 ENCOUNTER — Telehealth: Payer: Self-pay | Admitting: *Deleted

## 2018-12-05 NOTE — Telephone Encounter (Signed)
Pt calling to see if we have gotten her records from Dr. Anders Grant office. Please let pt know.

## 2018-12-05 NOTE — Telephone Encounter (Signed)
Notes are in Epic.  

## 2018-12-16 ENCOUNTER — Other Ambulatory Visit: Payer: Self-pay

## 2018-12-16 ENCOUNTER — Telehealth (INDEPENDENT_AMBULATORY_CARE_PROVIDER_SITE_OTHER): Payer: Medicare Other | Admitting: Cardiology

## 2018-12-16 ENCOUNTER — Encounter: Payer: Self-pay | Admitting: Cardiology

## 2018-12-16 VITALS — BP 173/78 | HR 83 | Ht 61.0 in | Wt 180.0 lb

## 2018-12-16 DIAGNOSIS — E1122 Type 2 diabetes mellitus with diabetic chronic kidney disease: Secondary | ICD-10-CM

## 2018-12-16 DIAGNOSIS — R1319 Other dysphagia: Secondary | ICD-10-CM

## 2018-12-16 DIAGNOSIS — R1310 Dysphagia, unspecified: Secondary | ICD-10-CM

## 2018-12-16 DIAGNOSIS — I10 Essential (primary) hypertension: Secondary | ICD-10-CM | POA: Diagnosis not present

## 2018-12-16 DIAGNOSIS — R001 Bradycardia, unspecified: Secondary | ICD-10-CM

## 2018-12-16 DIAGNOSIS — Z79899 Other long term (current) drug therapy: Secondary | ICD-10-CM

## 2018-12-16 DIAGNOSIS — N183 Chronic kidney disease, stage 3 unspecified: Secondary | ICD-10-CM

## 2018-12-16 MED ORDER — AMLODIPINE BESYLATE 10 MG PO TABS: 10.0000 mg | ORAL_TABLET | Freq: Every day | ORAL | 3 refills | Status: DC

## 2018-12-16 NOTE — Addendum Note (Signed)
Addended by: Pamala Hurry on: 12/16/2018 03:38 PM   Modules accepted: Orders

## 2018-12-16 NOTE — Patient Instructions (Signed)
Medication Instructions:  Your physician has recommended you make the following change in your medication:  START taking amlodipine 10 mg (0.5 tablet) once daily   If you need a refill on your cardiac medications before your next appointment, please call your pharmacy.   Lab work: NONE If you have labs (blood work) drawn today and your tests are completely normal, you will receive your results only by: . MyChart Message (if you have [facility]) OR . A paper copy in the mail If you have any lab test that is abnormal or we need to change your treatment, we will call you to review the results.  Testing/Procedures: Your physician has requested that you regularly monitor and record your blood pressure readings at home. Please use the same machine at the same time of day to check your readings and record them to bring to your follow-up visit.   Blood Pressure Record Sheet To take your blood pressure, you will need a blood pressure machine. You can buy a blood pressure machine (blood pressure monitor) at your clinic, drug store, or online. When choosing one, consider:  An automatic monitor that has an arm cuff.  A cuff that wraps snugly around your upper arm. You should be able to fit only one finger between your arm and the cuff.  A device that stores blood pressure reading results.  Do not choose a monitor that measures your blood pressure from your wrist or finger. Follow your health care provider's instructions for how to take your blood pressure. To use this form:  Get one reading in the morning (a.m.) before you take any medicines.  Get one reading in the evening (p.m.) before supper.  Take at least 2 readings with each blood pressure check. This makes sure the results are correct. Wait 1-2 minutes between measurements.  Write down the results in the spaces on this form.  Repeat this once a week, or as told by your health care provider.  Make a follow-up appointment with your  health care provider to discuss the results. Blood pressure log Date: _______________________  a.m. _____________________(1st reading) _____________________(2nd reading)  p.m. _____________________(1st reading) _____________________(2nd reading) Date: _______________________  a.m. _____________________(1st reading) _____________________(2nd reading)  p.m. _____________________(1st reading) _____________________(2nd reading) Date: _______________________  a.m. _____________________(1st reading) _____________________(2nd reading)  p.m. _____________________(1st reading) _____________________(2nd reading) Date: _______________________  a.m. _____________________(1st reading) _____________________(2nd reading)  p.m. _____________________(1st reading) _____________________(2nd reading) Date: _______________________  a.m. _____________________(1st reading) _____________________(2nd reading)  p.m. _____________________(1st reading) _____________________(2nd reading) This information is not intended to replace advice given to you by your health care provider. Make sure you discuss any questions you have with your health care provider. Document Released: 04/15/2003 Document Revised: 07/17/2017 Document Reviewed: 07/17/2017 Elsevier Interactive Patient Education  2019 [employer]     Follow-Up: [facility], you and your health needs are our priority.  As part of our continuing mission to provide you with exceptional heart care, we have created designated Provider Care Teams.  These Care Teams include your primary Cardiologist (physician) and Advanced Practice Providers (APPs -  Physician Assistants and Nurse Practitioners) who all work together to provide you with the care you need, when you need it. You will need a follow up appointment in 1 weeks.    Any Other Special Instructions Will Be Listed Below  Amlodipine tablets What is this medicine? AMLODIPINE (am LOE di peen) is  a calcium-channel blocker. It affects the amount of calcium found in your heart and muscle cells. This relaxes your blood  vessels, which can reduce the amount of work the heart has to do. This medicine is used to lower high blood pressure. It is also used to prevent chest pain. This medicine may be used for other purposes; ask your health care provider or pharmacist if you have questions. COMMON BRAND NAME(S): Norvasc What should I tell my health care provider before I take this medicine? They need to know if you have any of these conditions: -heart disease -liver disease -an unusual or allergic reaction to amlodipine, other medicines, foods, dyes, or preservatives -pregnant or trying to get pregnant -breast-feeding How should I use this medicine? Take this medicine by mouth with a glass of water. Follow the directions on the prescription label. You can take it with or without food. If it upsets your stomach, take it with food. Take your medicine at regular intervals. Do not take it more often than directed. Do not stop taking except on your doctor's advice. Talk to your pediatrician regarding the use of this medicine in children. While this drug may be prescribed for children as young as 6 years for selected conditions, precautions do apply. Patients over 43 years of age may have a stronger reaction and need a smaller dose. Overdosage: If you think you have taken too much of this medicine contact a poison control center or emergency room at once. NOTE: This medicine is only for you. Do not share this medicine with others. What if I miss a dose? If you miss a dose, take it as soon as you can. If it is almost time for your next dose, take only that dose. Do not take double or extra doses. What may interact with this medicine? Do not take this medicine with any of the following medications: -tranylcypromine This medicine may also interact with the following medications: -clarithromycin  -cyclosporine -diltiazem -itraconazole -simvastatin -tacrolimus This list may not describe all possible interactions. Give your health care provider a list of all the medicines, herbs, non-prescription drugs, or dietary supplements you use. Also tell them if you smoke, drink alcohol, or use illegal drugs. Some items may interact with your medicine. What should I watch for while using this medicine? Visit your healthcare professional for regular checks on your progress. Check your blood pressure as directed. Ask your healthcare professional what your blood pressure should be and when you should contact him or her. Do not treat yourself for coughs, colds, or pain while you are using this medicine without asking your healthcare professional for advice. Some medicines may increase your blood pressure. You may get dizzy. Do not drive, use machinery, or do anything that needs mental alertness until you know how this medicine affects you. Do not stand or sit up quickly, especially if you are an older patient. This reduces the risk of dizzy or fainting spells. Avoid alcoholic drinks; they can make you dizzier. What side effects may I notice from receiving this medicine? Side effects that you should report to your doctor or health care professional as soon as possible: -allergic reactions like skin rash, itching or hives; swelling of the face, lips, or tongue -fast, irregular heartbeat -signs and symptoms of low blood pressure like dizziness; feeling faint or lightheaded, falls; unusually weak or tired -swelling of ankles, feet, hands Side effects that usually do not require medical attention (report these to your doctor or health care professional if they continue or are bothersome): -dry mouth -facial flushing -headache -stomach pain -tiredness This list may not describe all possible side  effects. Call your doctor for medical advice about side effects. You may report side effects to FDA at  1-800-FDA-1088. Where should I keep my medicine? Keep out of the reach of children. Store at room temperature between 59 and 86 degrees F (15 and 30 degrees C). Throw away any unused medicine after the expiration date. NOTE: This sheet is a summary. It may not cover all possible information. If you have questions about this medicine, talk to your doctor, pharmacist, or health care provider.  2019 Elsevier/Gold Standard (2018-02-08 15:07:10)

## 2018-12-16 NOTE — Progress Notes (Signed)
Virtual Visit via Telephone Note   This visit type was conducted due to national recommendations for restrictions regarding the COVID-19 Pandemic (e.g. social distancing) in an effort to limit this patient's exposure and mitigate transmission in our community.  Due to her co-morbid illnesses, this patient is at least at moderate risk for complications without adequate follow up.  This format is felt to be most appropriate for this patient at this time.  The patient did not have access to video technology/had technical difficulties with video requiring transitioning to audio format only (telephone).  All issues noted in this document were discussed and addressed.  No physical exam could be performed with this format.  Please refer to the patient's chart for her  consent to telehealth for [facility].   Date:  12/16/2018   ID:  Kayla Horne, DOB 1945/12/06, MRN [id number]  Patient Location: Home Provider Location: Home  PCP:  Gordan Payment., MD  Cardiologist:  No primary care provider on file.  Electrophysiologist:  None   Evaluation Performed:  Follow-Up Visit  Chief Complaint: Posthospitalization  History of Present Illness:    Kayla Horne is a 73 y.o. female with past medical history of essential hypertension.  She went to [facility] for chest tightness.  She was evaluated and an acute coronary event was ruled out.  Subsequently she underwent stress testing which did not reveal any evidence of ischemia and the patient was discharged.  Since that time patient is doing fine.  No chest pain orthopnea or PND.  At the time of my evaluation, the patient is alert awake oriented and in no distress.  She is now at home and active although she ambulates within her confines of her home.  With this she has no symptoms and she has been referred to gastroenterologist.  The patient does not have symptoms concerning for COVID-19 infection (fever, chills, cough, or new shortness of  breath).    Past Medical History:  Diagnosis Date  . Anxiety   . DEGENERATIVE DISC DISEASE 10/12/2008   Qualifier: Diagnosis of  By: Maple Hudson MD, Clinton D   . Depression   . DIABETES, TYPE 2 02/16/2008   Qualifier: Diagnosis of  By: Maple Hudson MD, Clinton D   . Esophageal reflux 02/11/2008   Centricity Description: GASTROESOPHAGEAL REFLUX DISEASE, CHRONIC Qualifier: Diagnosis of  By: Maple Hudson MD, Rennis Chris  Centricity Description: Gean Maidens D Qualifier: Diagnosis of  By: Maple Hudson MD, Clinton D   . Hemoptysis 10/12/2008   Qualifier: Diagnosis of  By: Maple Hudson MD, Clinton D   . Hypertension   . Hypothyroidism   . Parkinsonism (HCC) 09/09/2015  . Restless leg syndrome 09/09/2015  . Tremor 09/09/2015   Past Surgical History:  Procedure Laterality Date  . ABDOMINAL HYSTERECTOMY    . BREAST REDUCTION SURGERY    . CERVICAL SPINE SURGERY    . CHOLECYSTECTOMY    . KNEE SURGERY     Arthroscopic right  . LUMBAR DISC SURGERY  2017   x 2     Current Meds  Medication Sig  . acetaminophen (TYLENOL) 650 MG CR tablet Take 1 tablet by mouth as needed.  . ALPRAZolam (XANAX) 1 MG tablet Take 0.5-1 mg by mouth 3 (three) times daily as needed. For anxiety  . amoxicillin (AMOXIL) 500 MG capsule Take 500 mg by mouth 4 (four) times daily.  Marland Kitchen aspirin 81 MG chewable tablet Chew 1 tablet by mouth daily.  . citalopram (CELEXA) 20 MG tablet Take  20 mg by mouth daily.  . hydrochlorothiazide (HYDRODIURIL) 25 MG tablet Take 25 mg by mouth daily.  . insulin NPH-regular Human (NOVOLIN 70/30) (70-30) 100 UNIT/ML injection Inject into the skin.  Marland Kitchen levothyroxine (SYNTHROID) 100 MCG tablet Take 100 mcg by mouth daily.   Marland Kitchen losartan (COZAAR) 100 MG tablet Take 100 mg by mouth daily.  . nitroGLYCERIN (NITROSTAT) 0.4 MG SL tablet Place 0.4 mg under the tongue every 5 (five) minutes as needed.  . ONE TOUCH ULTRA TEST test strip USE 1 STRIP TO CHECK GLUCOSE THREE TIMES DAILY  . oxybutynin (DITROPAN) 5 MG tablet Take 5 mg by mouth 2 (two) times  daily.  . pantoprazole (PROTONIX) 40 MG tablet Take 40 mg by mouth daily.  . pramipexole (MIRAPEX) 0.5 MG tablet Take 1 tablet by mouth daily.  . pravastatin (PRAVACHOL) 20 MG tablet 20 mg daily.  . traMADol (ULTRAM) 50 MG tablet Take 50 mg by mouth at bedtime.  . [DISCONTINUED] furosemide (LASIX) 40 MG tablet Take 40 mg by mouth daily.     Allergies:   Aspirin; Codeine sulfate; Morphine; Latex; and Tape   Social History   Tobacco Use  . Smoking status: Current Some Day Smoker    Packs/day: 0.25    Types: Cigarettes  . Smokeless tobacco: Never Used  . Tobacco comment: 5 -7 cigarettes a week  Substance Use Topics  . Alcohol use: No  . Drug use: No     Family Hx: The patient's family history includes CAD in her father; CAD (age of onset: 66) in her brother; CAD (age of onset: 50) in her brother; Restless legs syndrome in her mother; Stroke (age of onset: 40) in her father. There is no history of Tremor.  ROS:   Please see the history of present illness.    As mentioned above All other systems reviewed and are negative.   Prior CV studies:   The following studies were reviewed today:  I reviewed [facility] records extensively.  Labs/Other Tests and Data Reviewed:    EKG:  EKG done in March at [facility] reveals sinus rhythm and nonspecific ST-T changes.  Recent Labs: No results found for requested labs within last 8760 hours.   Recent Lipid Panel No results found for: CHOL, TRIG, HDL, CHOLHDL, LDLCALC, LDLDIRECT  Wt Readings from Last 3 Encounters:  12/16/18 180 lb (81.6 kg)  09/27/18 183 lb (83 kg)  11/07/16 171 lb 6.4 oz (77.7 kg)     Objective:    Vital Signs:  BP (!) 173/78 (BP Location: Left Arm, Patient Position: Sitting, Cuff Size: Normal)   Pulse 83   Ht  (1.549 m)   Wt 180 lb (81.6 kg)   BMI 34.01 kg/m    VITAL SIGNS:  reviewed  ASSESSMENT & PLAN:    1. Essential hypertension: I discussed my findings with the patient at  extensive length.  Diet was discussed with low salt.  I will prescribe her amlodipine 10 mg daily.  She will take half tablet daily.  She will keep a track of her blood pressures twice a day.  She will check her blood pressure only half an hour after she takes her medication.  She will let me know about these readings in a follow-up appointment by telephone in 1 week. 2. Obesity: Risks of obesity explained and I urged the.  Patient to diet appropriately and lose weight and she promises to do so.  COVID-19 Education: The signs and symptoms of COVID-19  were discussed with the patient and how to seek care for testing (follow up with PCP or arrange E-visit).  The importance of social distancing was discussed today.  Time:   Today, I have spent 45 minutes with the patient with telehealth technology discussing the above problems.  This involved [facility] and reviewing the obtained relevant information.   Medication Adjustments/Labs and Tests Ordered: Current medicines are reviewed at length with the patient today.  Concerns regarding medicines are outlined above.   Tests Ordered: No orders of the defined types were placed in this encounter.   Medication Changes: No orders of the defined types were placed in this encounter.   Disposition:  Follow up in 1 week(s)  Signed, Garwin Brothersajan R Revankar, MD  12/16/2018 2:15 PM     [facility]

## 2018-12-25 ENCOUNTER — Telehealth (INDEPENDENT_AMBULATORY_CARE_PROVIDER_SITE_OTHER): Payer: Medicare Other | Admitting: Cardiology

## 2018-12-25 ENCOUNTER — Other Ambulatory Visit: Payer: Self-pay

## 2018-12-25 ENCOUNTER — Encounter: Payer: Self-pay | Admitting: Cardiology

## 2018-12-25 VITALS — BP 151/81 | HR 62 | Ht 61.0 in | Wt 182.0 lb

## 2018-12-25 DIAGNOSIS — E088 Diabetes mellitus due to underlying condition with unspecified complications: Secondary | ICD-10-CM

## 2018-12-25 DIAGNOSIS — I10 Essential (primary) hypertension: Secondary | ICD-10-CM | POA: Diagnosis not present

## 2018-12-25 HISTORY — DX: Diabetes mellitus due to underlying condition with unspecified complications: E08.8

## 2018-12-25 NOTE — Patient Instructions (Signed)

## 2018-12-25 NOTE — Progress Notes (Signed)
Virtual Visit via Telephone Note   This visit type was conducted due to national recommendations for restrictions regarding the COVID-19 Pandemic (e.g. social distancing) in an effort to limit this patient's exposure and mitigate transmission in our community.  Due to her co-morbid illnesses, this patient is at least at moderate risk for complications without adequate follow up.  This format is felt to be most appropriate for this patient at this time.  The patient did not have access to video technology/had technical difficulties with video requiring transitioning to audio format only (telephone).  All issues noted in this document were discussed and addressed.  No physical exam could be performed with this format.  Please refer to the patient's chart for her  consent to telehealth for [facility].   Date:  12/25/2018   ID:  Kayla Horne, DOB August 01, 1945, MRN [id number]  Patient Location: Home Provider Location: Office  PCP:  Gordan Payment., MD  Cardiologist:  No primary care provider on file.  Electrophysiologist:  None   Evaluation Performed:  Follow-Up Visit  Chief Complaint: Essential hypertension  History of Present Illness:    Kayla Horne is a 73 y.o. female with past medical history of essential hypertension, obesity and diabetes mellitus.  She recently was in the hospital for chest pain and underwent stress testing which was negative.  Since then she is done fine.  No chest pain orthopnea or PND.  Because of orthopedic issues she leads a sedentary lifestyle.  At the time of my evaluation, the patient is alert awake oriented and in no distress.  The patient does not have symptoms concerning for COVID-19 infection (fever, chills, cough, or new shortness of breath).    Past Medical History:  Diagnosis Date  . Anxiety   . DEGENERATIVE DISC DISEASE 10/12/2008   Qualifier: Diagnosis of  By: Maple Hudson MD, Clinton D   . Depression   . DIABETES, TYPE 2 02/16/2008   Qualifier: Diagnosis of  By: Maple Hudson MD, Clinton D   . Esophageal reflux 02/11/2008   Centricity Description: GASTROESOPHAGEAL REFLUX DISEASE, CHRONIC Qualifier: Diagnosis of  By: Maple Hudson MD, Rennis Chris  Centricity Description: Gean Maidens D Qualifier: Diagnosis of  By: Maple Hudson MD, Clinton D   . Hemoptysis 10/12/2008   Qualifier: Diagnosis of  By: Maple Hudson MD, Clinton D   . Hypertension   . Hypothyroidism   . Parkinsonism (HCC) 09/09/2015  . Restless leg syndrome 09/09/2015  . Tremor 09/09/2015   Past Surgical History:  Procedure Laterality Date  . ABDOMINAL HYSTERECTOMY    . BREAST REDUCTION SURGERY    . CERVICAL SPINE SURGERY    . CHOLECYSTECTOMY    . KNEE SURGERY     Arthroscopic right  . LUMBAR DISC SURGERY  2017   x 2     Current Meds  Medication Sig  . acetaminophen (TYLENOL) 650 MG CR tablet Take 1 tablet by mouth as needed.  Marland Kitchen albuterol (VENTOLIN HFA) 108 (90 Base) MCG/ACT inhaler Inhale 1 puff into the lungs every 6 (six) hours as needed for wheezing or shortness of breath.  . ALPRAZolam (XANAX) 1 MG tablet Take 0.5-1 mg by mouth 3 (three) times daily as needed. For anxiety  . amLODipine (NORVASC) 10 MG tablet Take 1 tablet (10 mg total) by mouth daily. (Patient taking differently: Take 5 mg by mouth daily. )  . amoxicillin (AMOXIL) 500 MG capsule Take 500 mg by mouth 4 (four) times daily.  Marland Kitchen aspirin 81 MG chewable tablet Chew  1 tablet by mouth daily.  . citalopram (CELEXA) 20 MG tablet Take 20 mg by mouth daily.  . famotidine (PEPCID) 40 MG tablet Take 40 mg by mouth 2 (two) times daily.  . hydrochlorothiazide (HYDRODIURIL) 25 MG tablet Take 25 mg by mouth daily.  Marland Kitchen ibuprofen (ADVIL) 600 MG tablet Take 1 tablet by mouth as needed.  . insulin NPH-regular Human (NOVOLIN 70/30) (70-30) 100 UNIT/ML injection Inject into the skin.  Marland Kitchen levothyroxine (SYNTHROID) 100 MCG tablet Take 100 mcg by mouth daily.   Marland Kitchen losartan (COZAAR) 100 MG tablet Take 100 mg by mouth daily.  . nitroGLYCERIN (NITROSTAT) 0.4  MG SL tablet Place 0.4 mg under the tongue every 5 (five) minutes as needed.  . ONE TOUCH ULTRA TEST test strip USE 1 STRIP TO CHECK GLUCOSE THREE TIMES DAILY  . oxybutynin (DITROPAN) 5 MG tablet Take 5 mg by mouth 2 (two) times daily.  . pravastatin (PRAVACHOL) 20 MG tablet Take 20 mg by mouth daily.   . traMADol (ULTRAM) 50 MG tablet Take 50 mg by mouth at bedtime.     Allergies:   Aspirin; Codeine sulfate; Morphine; Latex; and Tape   Social History   Tobacco Use  . Smoking status: Current Some Day Smoker    Packs/day: 0.25    Types: Cigarettes  . Smokeless tobacco: Never Used  . Tobacco comment: 5 -7 cigarettes a week  Substance Use Topics  . Alcohol use: No  . Drug use: No     Family Hx: The patient's family history includes CAD in her father; CAD (age of onset: 78) in her brother; CAD (age of onset: 36) in her brother; Restless legs syndrome in her mother; Stroke (age of onset: 20) in her father. There is no history of Tremor.  ROS:   Please see the history of present illness.    Mentioned above All other systems reviewed and are negative.   Prior CV studies:   The following studies were reviewed today:  As mentioned above stress test from [facility] was unremarkable  Labs/Other Tests and Data Reviewed:    EKG:  No ECG reviewed.  Recent Labs: No results found for requested labs within last 8760 hours.   Recent Lipid Panel No results found for: CHOL, TRIG, HDL, CHOLHDL, LDLCALC, LDLDIRECT  Wt Readings from Last 3 Encounters:  12/25/18 182 lb (82.6 kg)  12/16/18 180 lb (81.6 kg)  09/27/18 183 lb (83 kg)     Objective:    Vital Signs:  BP (!) 151/81 (BP Location: Left Arm, Patient Position: Sitting, Cuff Size: Normal)   Pulse 62   Ht  (1.549 m)   Wt 182 lb (82.6 kg)   BMI 34.39 kg/m    VITAL SIGNS:  reviewed  ASSESSMENT & PLAN:    1. Essential hypertension: Her blood pressure is stable.  She mentions to me multiple blood pressure  recordings which is in the range of 130 systolic.  She will continue her current medications.  Diet and exercise was stressed. 2. Diabetes mellitus: Managed by her primary care physician.  Again importance of diet and exercise stressed. 3. Mixed dyslipidemia: Recently she had blood work at primary care doctor's office.  I do not have access to them at this point.  She was told that they were fine. 4. Patient will be seen in follow-up appointment in 3 months or earlier if the patient has any concerns   COVID-19 Education: The signs and symptoms of COVID-19 were discussed with  the patient and how to seek care for testing (follow up with PCP or arrange E-visit).  The importance of social distancing was discussed today.  Time:   Today, I have spent 15 minutes with the patient with telehealth technology discussing the above problems.     Medication Adjustments/Labs and Tests Ordered: Current medicines are reviewed at length with the patient today.  Concerns regarding medicines are outlined above.   Tests Ordered: No orders of the defined types were placed in this encounter.   Medication Changes: No orders of the defined types were placed in this encounter.   Disposition:  Follow up in 3 month(s)  Signed, Garwin Brothersajan R Diantha Paxson, MD  12/25/2018 11:16 AM    [facility]

## 2019-01-02 ENCOUNTER — Telehealth: Payer: Self-pay | Admitting: Cardiology

## 2019-01-02 NOTE — Telephone Encounter (Signed)
Patient called and thy have questions about a med that her PCP prescribed fluid pill and we also have her on fluid pill, please call patient.

## 2019-01-03 ENCOUNTER — Telehealth: Payer: Self-pay

## 2019-01-03 DIAGNOSIS — Z5181 Encounter for therapeutic drug level monitoring: Secondary | ICD-10-CM

## 2019-01-03 MED ORDER — METOLAZONE 5 MG PO TABS: 5.0000 mg | ORAL_TABLET | Freq: Every day | ORAL | 4 refills | Status: DC

## 2019-01-03 NOTE — Addendum Note (Signed)
Addended by: Pamala Hurry on: 01/03/2019 10:49 AM   Modules accepted: Orders

## 2019-01-03 NOTE — Telephone Encounter (Signed)
Patient prescribed HCTZ 25 mg daily by Dr. Janey Genta and Dr.Grisso prescribed furosemide every other day. Patient having increased swelling in ankles and feet. No SOB and no chest pain. Patients current BP 161/75, HR 86, SPO2-94,weight 183 lbs. Dr. Shary Decamp interested in increasing lasix to help with fluid retention but advised patient to consult with Dr. Janey Genta prior. Information forwarded for advisement?

## 2019-01-03 NOTE — Telephone Encounter (Signed)
Patinent informed of medication changes. RN reviewed foods that are high in potassium with patient. She will come into office Monday 01/06/19 for repeat labs.

## 2019-01-03 NOTE — Telephone Encounter (Signed)
Stop both these medicines hydrochlorothiazide and furosemide and start Zaroxolyn 5 mg daily.  Encourage potassium supplementation in diet.  Have her come for a Chem-7 to her office on Monday and bring a log of pulse blood pressure and weight checks.  I reviewed [facility] hospital records at length to come to this decision.

## 2019-01-06 LAB — BASIC METABOLIC PANEL
BUN/Creatinine Ratio: 25 (ref 12–28)
BUN: 26 mg/dL (ref 8–27)
CO2: 22 mmol/L (ref 20–29)
Calcium: 10 mg/dL (ref 8.7–10.3)
Chloride: 98 mmol/L (ref 96–106)
Creatinine, Ser: 1.03 mg/dL — ABNORMAL HIGH (ref 0.57–1.00)
GFR calc Af Amer: 62 mL/min/{1.73_m2} (ref >59)
GFR calc non Af Amer: 54 mL/min/{1.73_m2} — ABNORMAL LOW (ref >59)
Glucose: 193 mg/dL — ABNORMAL HIGH (ref 65–99)
Potassium: 3.6 mmol/L (ref 3.5–5.2)
Sodium: 137 mmol/L (ref 134–144)

## 2019-01-08 ENCOUNTER — Telehealth: Payer: Self-pay

## 2019-01-08 NOTE — Telephone Encounter (Signed)
-----   Message from Jenean Lindau, MD sent at 01/07/2019  8:11 AM EDT ----- The results of the study is unremarkable. Please inform patient. I will discuss in detail at next appointment. Cc  primary care/referring physician Jenean Lindau, MD 01/07/2019 8:11 AM

## 2019-01-08 NOTE — Telephone Encounter (Signed)
Called patient and left detailed voice message on patients phone regarding lab results. 

## 2019-01-14 DIAGNOSIS — N390 Urinary tract infection, site not specified: Secondary | ICD-10-CM

## 2019-01-14 HISTORY — DX: Urinary tract infection, site not specified: N39.0

## 2019-03-28 ENCOUNTER — Telehealth: Payer: Medicare Other | Admitting: Cardiology

## 2019-04-10 ENCOUNTER — Telehealth: Payer: Self-pay | Admitting: Cardiology

## 2019-04-10 NOTE — Telephone Encounter (Signed)
Has questions about her amlodipine dosage

## 2019-04-10 NOTE — Telephone Encounter (Signed)
Patient states she is only been taking 5 mg amlodipine daily. Prescribed 10 mg but does not feel comfortable taking whole tablet. She has been having elevated BP's for past 2 days. RN expressed need for patient to take medication as prescribed. She will start taking 5 mg in morning and 5 mg at bedtime and log BP's. To be discussed further on 04/18/19 during her f/u appt with Dr. Docia Furl.

## 2019-04-18 ENCOUNTER — Ambulatory Visit (INDEPENDENT_AMBULATORY_CARE_PROVIDER_SITE_OTHER): Payer: Medicare Other | Admitting: Cardiology

## 2019-04-18 ENCOUNTER — Other Ambulatory Visit: Payer: Self-pay

## 2019-04-18 ENCOUNTER — Encounter: Payer: Self-pay | Admitting: Cardiology

## 2019-04-18 VITALS — BP 120/70 | HR 97 | Ht 61.0 in | Wt 189.0 lb

## 2019-04-18 DIAGNOSIS — G250 Essential tremor: Secondary | ICD-10-CM

## 2019-04-18 DIAGNOSIS — E088 Diabetes mellitus due to underlying condition with unspecified complications: Secondary | ICD-10-CM

## 2019-04-18 DIAGNOSIS — I10 Essential (primary) hypertension: Secondary | ICD-10-CM

## 2019-04-18 DIAGNOSIS — E782 Mixed hyperlipidemia: Secondary | ICD-10-CM

## 2019-04-18 MED ORDER — NITROGLYCERIN 0.4 MG SL SUBL: 0.4000 mg | SUBLINGUAL_TABLET | SUBLINGUAL | 4 refills | Status: DC | PRN

## 2019-04-18 NOTE — Progress Notes (Addendum)
Cardiology Office Note:    Date:  04/18/2019   ID:  ALEYLA SAMAND, DOB 1946/05/22, MRN [id number]  PCP:  Gordan Payment., MD  Cardiologist:  Garwin Brothers, MD   Referring MD: Gordan Payment., MD    ASSESSMENT:    1. Essential hypertension   2. 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 Essential tremor   4. Mixed hyperlipidemia    PLAN:    In order of problems listed above:  1. Essential hypertension: Her blood pressure stable and she is on appropriate medications.  Electrolytes and such are followed by primary care physician. 2. Mixed dyslipidemia and diabetes mellitus: Lipids are followed by primary care physician.  I told her to mention to her primary care doctor that she needs to be on statin therapy if there are no contraindications according to the current guidelines.  She agrees to do that. weight reduction was stressed and diet was discussed. 3. Patient will be seen in follow-up appointment in 6 months or earlier if the patient has any concerns  Addendum on 05/06/2019: We received a request from the orthopedic surgeons about preoperative risk stratification for Ms. Durley.  I reviewed her records extensively including stress test done earlier this year.  I give her a phone call.  She mentions to me that she is having no symptoms from a cardiovascular standpoint.  No chest pain orthopnea or any other issues or change in symptom profile.  She ambulates appropriate to what her knee can allow her.  In view of this I think she is not at high risk for coronary events during the aforementioned surgery.  Meticulous hemodynamic monitoring will further reduce the risk of coronary events.  Please do not hesitate to call us if there are any questions about her cardiovascular management. Signed Dr. Belva Crome MD 10:24 AM.    Medication Adjustments/Labs and Tests Ordered: Current medicines are reviewed at length with the  patient today.  Concerns regarding medicines are outlined above.  No orders of the defined types were placed in this encounter.  No orders of the defined types were placed in this encounter.    Chief Complaint  Patient presents with   Follow-up     History of Present Illness:    NETHRA FRANCIONE is a 73 y.o. female.  Patient has past medical history of essential hypertension and diabetes mellitus and dyslipidemia.  She is having significant knee problems and therefore she is not ambulating well.  No chest pain orthopnea or PND.  At the time of my evaluation, the patient is alert awake oriented and in no distress.  Past Medical History:  Diagnosis Date   Anxiety    DEGENERATIVE DISC DISEASE 10/12/2008   Qualifier: Diagnosis of  By: Maple Hudson MD, Clinton D    Depression    DIABETES, TYPE 2 02/16/2008   Qualifier: Diagnosis of  By: Maple Hudson MD, Clinton D    Esophageal reflux 02/11/2008   Centricity Description: GASTROESOPHAGEAL REFLUX DISEASE, CHRONIC Qualifier: Diagnosis of  By: Maple Hudson MD, Rennis Chris  Centricity Description: Gean Maidens D Qualifier: Diagnosis of  By: Maple Hudson MD, Vindex.Nunnery D    Hemoptysis 10/12/2008   Qualifier: Diagnosis of  By: Maple Hudson MD, Clinton D    Hypertension    Hypothyroidism    Parkinsonism (HCC) 09/09/2015   Restless leg syndrome 09/09/2015   Tremor 09/09/2015    Past Surgical History:  Procedure Laterality Date   ABDOMINAL HYSTERECTOMY  BREAST REDUCTION SURGERY     CERVICAL SPINE SURGERY     CHOLECYSTECTOMY     KNEE SURGERY     Arthroscopic right   LUMBAR DISC SURGERY  2017   x 2    Current Medications: Current Meds  Medication Sig   ALPRAZolam (XANAX) 1 MG tablet Take 0.5-1 mg by mouth 3 (three) times daily as needed. For anxiety   amLODipine (NORVASC) 10 MG tablet Take 5 mg by mouth daily.   aspirin 81 MG chewable tablet Chew 1 tablet by mouth daily.   Boswellia-Glucosamine-Vit D (GLUCOSAMINE COMPLEX PO) Take 1,000 mg by mouth daily.    citalopram (CELEXA) 20 MG tablet Take 20 mg by mouth daily.   diphenoxylate-atropine (LOMOTIL) 2.5-0.025 MG tablet Take by mouth 4 (four) times daily as needed for diarrhea or loose stools.   furosemide (LASIX) 20 MG tablet Take 40 mg by mouth daily.   insulin NPH-regular Human (NOVOLIN 70/30) (70-30) 100 UNIT/ML injection Inject into the skin.   levothyroxine (SYNTHROID) 100 MCG tablet Take 100 mcg by mouth daily.    losartan (COZAAR) 100 MG tablet Take 100 mg by mouth daily.   nitroGLYCERIN (NITROSTAT) 0.4 MG SL tablet Place 0.4 mg under the tongue every 5 (five) minutes as needed.   ONE TOUCH ULTRA TEST test strip USE 1 STRIP TO CHECK GLUCOSE THREE TIMES DAILY   oxybutynin (DITROPAN) 5 MG tablet Take 5 mg by mouth 2 (two) times daily.   pantoprazole (PROTONIX) 40 MG tablet Take 40 mg by mouth daily.   pramipexole (MIRAPEX) 0.5 MG tablet Take 0.5 mg by mouth 3 (three) times daily.      Allergies:   Aspirin, Codeine sulfate, Morphine, Latex, and Tape   Social History   Socioeconomic History   Marital status: Single    Spouse name: Not on file   Number of children: 1   Years of education: 12   Highest education level: Not on file  Occupational History   Occupation: retired  Scientist, product/process development strain: Not on file   Food insecurity    Worry: Not on file    Inability: Not on Lexicographer needs    Medical: Not on file    Non-medical: Not on file  Tobacco Use   Smoking status: Current Some Day Smoker    Packs/day: 0.25    Types: Cigarettes   Smokeless tobacco: Never Used   Tobacco comment: 5 -7 cigarettes a week  Substance and Sexual Activity   Alcohol use: No   Drug use: No   Sexual activity: Not on file  Lifestyle   Physical activity    Days per week: Not on file    Minutes per session: Not on file   Stress: Not on file  Relationships   Social connections    Talks on phone: Not on file    Gets together: Not on file     Attends religious service: Not on file    Active member of club or organization: Not on file    Attends meetings of clubs or organizations: Not on file    Relationship status: Not on file  Other Topics Concern   Not on file  Social History Narrative   Lives with two dogs and two cats.  One adopted son.        Patient does not drink caffeine.   Patient is right handed.      Family History: The patient's family history includes CAD in  her father; CAD (age of onset: 9060) in her brother; CAD (age of onset: 1770) in her brother; Restless legs syndrome in her mother; Stroke (age of onset: 1266) in her father. There is no history of Tremor.  ROS:   Please see the history of present illness.    All other systems reviewed and are negative.  EKGs/Labs/Other Studies Reviewed:    The following studies were reviewed today: I reviewed lab work from previous visits   Recent Labs: 01/06/2019: BUN 26; Creatinine, Ser 1.03; Potassium 3.6; Sodium 137  Recent Lipid Panel No results found for: CHOL, TRIG, HDL, CHOLHDL, VLDL, LDLCALC, LDLDIRECT  Physical Exam:    VS:  BP 120/70 (BP Location: Left Arm, Patient Position: Sitting, Cuff Size: Normal)    Pulse 97    Ht 5\' 1"  (1.549 m)    Wt 189 lb (85.7 kg)    SpO2 97%    BMI 35.71 kg/m     Wt Readings from Last 3 Encounters:  04/18/19 189 lb (85.7 kg)  12/25/18 182 lb (82.6 kg)  12/16/18 180 lb (81.6 kg)     GEN: Patient is in no acute distress HEENT: Normal NECK: No JVD; No carotid bruits LYMPHATICS: No lymphadenopathy CARDIAC: Hear sounds regular, 2/6 systolic murmur at the apex. RESPIRATORY:  Clear to auscultation without rales, wheezing or rhonchi  ABDOMEN: Soft, non-tender, non-distended MUSCULOSKELETAL:  No edema; No deformity  SKIN: Warm and dry NEUROLOGIC:  Alert and oriented x 3 PSYCHIATRIC:  Normal affect   Signed, Garwin Brothersajan R Anuja Manka, MD  04/18/2019 1:46 PM    [facility]

## 2019-04-18 NOTE — Patient Instructions (Signed)

## 2019-05-12 ENCOUNTER — Other Ambulatory Visit: Payer: Self-pay | Admitting: Orthopedic Surgery

## 2019-06-10 NOTE — Patient Instructions (Signed)
DUE TO COVID-19 ONLY ONE VISITOR IS ALLOWED TO COME WITH YOU AND STAY IN THE WAITING ROOM ONLY DURING PRE OP AND PROCEDURE DAY OF SURGERY. THE 1 VISITOR MAY VISIT WITH YOU AFTER SURGERY IN YOUR PRIVATE ROOM DURING VISITING HOURS ONLY!  YOU NEED TO HAVE A COVID 19 TEST ON____11/12/2020___ @_______ , THIS TEST MUST BE DONE BEFORE SURGERY, COME  [address], Kayla Horne , .  [facility]) ONCE YOUR COVID TEST IS COMPLETED, PLEASE BEGIN THE QUARANTINE INSTRUCTIONS AS OUTLINED IN YOUR HANDOUT.                Kayla Horne    Your procedure is scheduled on: 06/16/2019   Report to [facility] Main  Entrance   Report to admitting at 0705 AM     Call this number if you have problems the morning of surgery [phone]    NO SOLID FOOD AFTER MIDNIGHT THE NIGHT PRIOR TO SURGERY. NOTHING BY MOUTH EXCEPT CLEAR LIQUIDS UNTIL 07-10-1998 . PLEASE FINISH G2 DRINK PER SURGEON ORDER  WHICH NEEDS TO BE COMPLETED AT  0625.     CLEAR LIQUID DIET   Foods Allowed                                                                     Foods Excluded  Coffee and tea, regular and decaf                             liquids that you cannot  Plain Jell-O any favor except red or purple                                           see through such as: Fruit ices (not with fruit pulp)                                     milk, soups, orange juice  Iced Popsicles                                    All solid food Carbonated beverages, regular and diet                                    Cranberry, grape and apple juices Sports drinks like Gatorade Lightly seasoned clear broth or consume(fat free) Sugar, honey syrup  Sample Menu Breakfast                                Lunch                                     Supper Cranberry juice  Beef broth                            Chicken broth Jell-O                                     Grape juice                            Apple juice Coffee or tea                        Jell-O                                      Popsicle                                                Coffee or tea                        Coffee or tea  _____________________________________________________________________        Take these medicines the morning of surgery with A SIP OF WATER: Norvasc, Synthroid, Protonix, Ditropan, Celexa, Xanax PRN.  DO NOT TAKE ANY DIABETIC MEDICATIONS DAY OF YOUR SURGERY                               You may not have any metal on your body including hair pins and              piercings  Do not wear jewelry, make-up, lotions, powders or perfumes, deodorant             Do not wear nail polish on your fingernails.  Do not shave  48 hours prior to surgery.              Men may shave face and neck.   Do not bring valuables to the hospital. [facility] IS NOT             RESPONSIBLE   FOR VALUABLES.  Contacts, dentures or bridgework may not be worn into surgery.  Leave suitcase in the car. After surgery it may be brought to your room.  [facility] - Preparing for Surgery Before surgery, you can play an important role.  Because skin is not sterile, your skin needs to be as free of germs as possible.  You can reduce the number of germs on your skin by washing with CHG (chlorahexidine gluconate) soap before surgery.  CHG is an antiseptic cleaner which kills germs and bonds with the skin to continue killing germs even after washing. Please DO NOT use if you have an allergy to CHG or antibacterial soaps.  If your skin becomes reddened/irritated stop using the CHG and inform your nurse when you arrive at Short Stay. Do not shave (including legs and underarms) for at least 48 hours prior to the first CHG shower.  You may shave your face/neck.  Please follow these instructions carefully:  1.  Shower with CHG Soap the night before surgery  and the  morning of surgery.  2.  If you choose to wash your hair, wash your  hair first as usual with your normal  shampoo.  3.  After you shampoo, rinse your hair and body thoroughly to remove the shampoo.                             4.  Use CHG as you would any other liquid soap.  You can apply chg directly to the skin and wash.  Gently with a scrungie or clean washcloth.  5.  Apply the CHG Soap to your body ONLY FROM THE NECK DOWN.   Do   not use on face/ open                           Wound or open sores. Avoid contact with eyes, ears mouth and   genitals (private parts).                       Wash face,  Genitals (private parts) with your normal soap.             6.  Wash thoroughly, paying special attention to the area where your    surgery  will be performed.  7.  Thoroughly rinse your body with warm water from the neck down.  8.  DO NOT shower/wash with your normal soap after using and rinsing off the CHG Soap.                9.  Pat yourself dry with a clean towel.            10.  Wear clean pajamas.            11.  Place clean sheets on your bed the night of your first shower and do not  sleep with pets. Day of Surgery : Do not apply any lotions/deodorants the morning of surgery.  Please wear clean clothes to the hospital/surgery center.  FAILURE TO FOLLOW THESE INSTRUCTIONS MAY RESULT IN THE CANCELLATION OF YOUR SURGERY  PATIENT SIGNATURE_________________________________  NURSE SIGNATURE__________________________________  ________________________________________________________________________     Kayla MireIncentive Spirometer  An incentive spirometer is a tool that can help keep your lungs clear and active. This tool measures how well you are filling your lungs with each breath. Taking long deep breaths may help reverse or decrease the chance of developing breathing (pulmonary) problems (especially infection) following:  A long period of time when you are unable to move or be active. BEFORE THE PROCEDURE   If the spirometer includes an indicator to show  your best effort, your nurse or respiratory therapist will set it to a desired goal.  If possible, sit up straight or lean slightly forward. Try not to slouch.  Hold the incentive spirometer in an upright position. INSTRUCTIONS FOR USE  1. Sit on the edge of your bed if possible, or sit up as far as you can in bed or on a chair. 2. Hold the incentive spirometer in an upright position. 3. Breathe out normally. 4. Place the mouthpiece in your mouth and seal your lips tightly around it. 5. Breathe in slowly and as deeply as possible, raising the piston or the ball toward the top of the column. 6. Hold your breath for 3-5 seconds or for as long as possible. Allow the piston or ball to fall to the  bottom of the column. 7. Remove the mouthpiece from your mouth and breathe out normally. 8. Rest for a few seconds and repeat Steps 1 through 7 at least 10 times every 1-2 hours when you are awake. Take your time and take a few normal breaths between deep breaths. 9. The spirometer may include an indicator to show your best effort. Use the indicator as a goal to work toward during each repetition. 10. After each set of 10 deep breaths, practice coughing to be sure your lungs are clear. If you have an incision (the cut made at the time of surgery), support your incision when coughing by placing a pillow or rolled up towels firmly against it. Once you are able to get out of bed, walk around indoors and cough well. You may stop using the incentive spirometer when instructed by your caregiver.  RISKS AND COMPLICATIONS  Take your time so you do not get dizzy or light-headed.  If you are in pain, you may need to take or ask for pain medication before doing incentive spirometry. It is harder to take a deep breath if you are having pain. AFTER USE  Rest and breathe slowly and easily.  It can be helpful to keep track of a log of your progress. Your caregiver can provide you with a simple table to help with  this. If you are using the spirometer at home, follow these instructions: [place] IF:   You are having difficultly using the spirometer.  You have trouble using the spirometer as often as instructed.  Your pain medication is not giving enough relief while using the spirometer.  You develop fever of 100.5 F (38.1 C) or higher. SEEK IMMEDIATE MEDICAL CARE IF:   You cough up bloody sputum that had not been present before.  You develop fever of 102 F (38.9 C) or greater.  You develop worsening pain at or near the incision site. MAKE SURE YOU:   Understand these instructions.  Will watch your condition.  Will get help right away if you are not doing well or get worse. Document Released: 11/27/2006 Document Revised: 10/09/2011 Document Reviewed: 01/28/2007 [facility] Patient Information 2014 [place], [place].   ________________________________________________________________________

## 2019-06-12 ENCOUNTER — Other Ambulatory Visit (HOSPITAL_COMMUNITY)
Admission: RE | Admit: 2019-06-12 | Discharge: 2019-06-12 | Disposition: A | Discharge status: Home / Self Care | Payer: Medicare Other | Source: Ambulatory Visit | Attending: Orthopedic Surgery | Admitting: Orthopedic Surgery

## 2019-06-12 DIAGNOSIS — Z01812 Encounter for preprocedural laboratory examination: Secondary | ICD-10-CM | POA: Diagnosis present

## 2019-06-12 DIAGNOSIS — Z20828 Contact with and (suspected) exposure to other viral communicable diseases: Secondary | ICD-10-CM | POA: Insufficient documentation

## 2019-06-13 ENCOUNTER — Encounter (HOSPITAL_COMMUNITY): Payer: Self-pay

## 2019-06-13 ENCOUNTER — Encounter (HOSPITAL_COMMUNITY)
Admission: RE | Admit: 2019-06-13 | Discharge: 2019-06-13 | Disposition: A | Discharge status: Home / Self Care | Payer: Medicare Other | Source: Ambulatory Visit | Attending: Orthopedic Surgery | Admitting: Orthopedic Surgery

## 2019-06-13 ENCOUNTER — Other Ambulatory Visit: Payer: Self-pay

## 2019-06-13 DIAGNOSIS — Z01812 Encounter for preprocedural laboratory examination: Secondary | ICD-10-CM | POA: Diagnosis not present

## 2019-06-13 DIAGNOSIS — M1711 Unilateral primary osteoarthritis, right knee: Secondary | ICD-10-CM | POA: Insufficient documentation

## 2019-06-13 LAB — CBC WITH DIFFERENTIAL/PLATELET
Abs Immature Granulocytes: 0.03 10*3/uL (ref 0.00–0.07)
Basophils Absolute: 0.1 10*3/uL (ref 0.0–0.1)
Basophils Relative: 1 %
Eosinophils Absolute: 0.2 10*3/uL (ref 0.0–0.5)
Eosinophils Relative: 3 %
HCT: 43.6 % (ref 36.0–46.0)
Hemoglobin: 14.1 g/dL (ref 12.0–15.0)
Immature Granulocytes: 0 %
Lymphocytes Relative: 31 %
Lymphs Abs: 2.6 10*3/uL (ref 0.7–4.0)
MCH: 29.9 pg (ref 26.0–34.0)
MCHC: 32.3 g/dL (ref 30.0–36.0)
MCV: 92.4 fL (ref 80.0–100.0)
Monocytes Absolute: 0.6 10*3/uL (ref 0.1–1.0)
Monocytes Relative: 7 %
Neutro Abs: 4.7 10*3/uL (ref 1.7–7.7)
Neutrophils Relative %: 58 %
Platelets: 208 10*3/uL (ref 150–400)
RBC: 4.72 MIL/uL (ref 3.87–5.11)
RDW: 13.1 % (ref 11.5–15.5)
WBC: 8.2 10*3/uL (ref 4.0–10.5)
nRBC: 0 % (ref 0.0–0.2)

## 2019-06-13 LAB — COMPREHENSIVE METABOLIC PANEL
ALT: 39 U/L (ref 0–44)
AST: 35 U/L (ref 15–41)
Albumin: 4.2 g/dL (ref 3.5–5.0)
Alkaline Phosphatase: 112 U/L (ref 38–126)
Anion gap: 8 (ref 5–15)
BUN: 22 mg/dL (ref 8–23)
CO2: 25 mmol/L (ref 22–32)
Calcium: 9.5 mg/dL (ref 8.9–10.3)
Chloride: 107 mmol/L (ref 98–111)
Creatinine, Ser: 0.94 mg/dL (ref 0.44–1.00)
GFR calc Af Amer: >60 mL/min (ref >60)
GFR calc non Af Amer: >60 mL/min (ref >60)
Glucose, Bld: 67 mg/dL — ABNORMAL LOW (ref 70–99)
Potassium: 4.2 mmol/L (ref 3.5–5.1)
Sodium: 140 mmol/L (ref 135–145)
Total Bilirubin: 0.5 mg/dL (ref 0.3–1.2)
Total Protein: 7.1 g/dL (ref 6.5–8.1)

## 2019-06-13 LAB — NOVEL CORONAVIRUS, NAA (HOSP ORDER, SEND-OUT TO REF LAB; TAT 18-24 HRS): SARS-CoV-2, NAA: NOT DETECTED

## 2019-06-13 LAB — SURGICAL PCR SCREEN
MRSA, PCR: NEGATIVE
Staphylococcus aureus: NEGATIVE

## 2019-06-13 LAB — GLUCOSE, CAPILLARY: Glucose-Capillary: 81 mg/dL (ref 70–99)

## 2019-06-13 MED ORDER — CHLORHEXIDINE GLUCONATE 4 % EX LIQD: 60.0000 mL | Freq: Once | CUTANEOUS | Status: DC

## 2019-06-13 NOTE — Progress Notes (Signed)
SPOKE W/  _Linda     SCREENING SYMPTOMS OF COVID 19:   COUGH--no  RUNNY NOSE--- no  SORE THROAT---no  NASAL CONGESTION----no  SNEEZING----no  SHORTNESS OF BREATH---no  DIFFICULTY BREATHING---no  TEMP >100.0 -----no  UNEXPLAINED BODY ACHES------no  CHILLS -------- no  HEADACHES ---------no  LOSS OF SMELL/ TASTE --------no    HAVE YOU OR ANY FAMILY MEMBER TRAVELLED PAST 14 DAYS OUT OF THE   COUNTY---Yes STATE----no COUNTRY----no  HAVE YOU OR ANY FAMILY MEMBER BEEN EXPOSED TO ANYONE WITH COVID 19? no

## 2019-06-13 NOTE — Progress Notes (Signed)
PCP - Dr. Bea Graff Cardiologist - Dr. Geraldo Pitter  Chest x-ray -  EKG -  Stress Test -  ECHO -  Cardiac Cath -   Sleep Study -  CPAP -   Fasting Blood Sugar - 81 Checks Blood Sugar _____ times a day  Blood Thinner Instructions:NONE Aspirin Instructions: Last Dose:  Anesthesia review: Reviewed by Konrad Felix, PA. Cardiac clearance on chart.  Patient denies shortness of breath, fever, cough and chest pain at PAT appointment   Patient verbalized understanding of instructions that were given to them at the PAT appointment. Patient was also instructed that they will need to review over the PAT instructions again at home before surgery.

## 2019-06-15 MED ORDER — BUPIVACAINE LIPOSOME 1.3 % IJ SUSP
20.0000 mL | Freq: Once | INTRAMUSCULAR | Status: AC
  Filled 2019-06-15: qty 20

## 2019-06-16 ENCOUNTER — Ambulatory Visit (HOSPITAL_COMMUNITY): Payer: Medicare Other | Admitting: Physician Assistant

## 2019-06-16 ENCOUNTER — Encounter (HOSPITAL_COMMUNITY)
Admission: RE | Disposition: A | Discharge status: Home / Self Care | Payer: Self-pay | Source: Home / Self Care | Attending: Orthopedic Surgery

## 2019-06-16 ENCOUNTER — Encounter (HOSPITAL_COMMUNITY): Payer: Self-pay

## 2019-06-16 ENCOUNTER — Observation Stay (HOSPITAL_COMMUNITY)
Admission: RE | Admit: 2019-06-16 | Discharge: 2019-06-17 | Disposition: A | Discharge status: Home / Self Care | Payer: Medicare Other | Attending: Orthopedic Surgery | Admitting: Orthopedic Surgery

## 2019-06-16 ENCOUNTER — Ambulatory Visit (HOSPITAL_COMMUNITY): Payer: Medicare Other | Admitting: Certified Registered Nurse Anesthetist

## 2019-06-16 ENCOUNTER — Other Ambulatory Visit: Payer: Self-pay

## 2019-06-16 DIAGNOSIS — F329 Major depressive disorder, single episode, unspecified: Secondary | ICD-10-CM | POA: Diagnosis not present

## 2019-06-16 DIAGNOSIS — E1140 Type 2 diabetes mellitus with diabetic neuropathy, unspecified: Secondary | ICD-10-CM | POA: Insufficient documentation

## 2019-06-16 DIAGNOSIS — G2581 Restless legs syndrome: Secondary | ICD-10-CM | POA: Insufficient documentation

## 2019-06-16 DIAGNOSIS — I10 Essential (primary) hypertension: Secondary | ICD-10-CM | POA: Diagnosis not present

## 2019-06-16 DIAGNOSIS — I129 Hypertensive chronic kidney disease with stage 1 through stage 4 chronic kidney disease, or unspecified chronic kidney disease: Secondary | ICD-10-CM | POA: Diagnosis not present

## 2019-06-16 DIAGNOSIS — Z79899 Other long term (current) drug therapy: Secondary | ICD-10-CM | POA: Insufficient documentation

## 2019-06-16 DIAGNOSIS — R001 Bradycardia, unspecified: Secondary | ICD-10-CM | POA: Diagnosis not present

## 2019-06-16 DIAGNOSIS — Z794 Long term (current) use of insulin: Secondary | ICD-10-CM | POA: Diagnosis not present

## 2019-06-16 DIAGNOSIS — Z820 Family history of epilepsy and other diseases of the nervous system: Secondary | ICD-10-CM | POA: Insufficient documentation

## 2019-06-16 DIAGNOSIS — K219 Gastro-esophageal reflux disease without esophagitis: Secondary | ICD-10-CM | POA: Insufficient documentation

## 2019-06-16 DIAGNOSIS — E785 Hyperlipidemia, unspecified: Secondary | ICD-10-CM | POA: Diagnosis not present

## 2019-06-16 DIAGNOSIS — M1711 Unilateral primary osteoarthritis, right knee: Principal | ICD-10-CM | POA: Insufficient documentation

## 2019-06-16 DIAGNOSIS — E538 Deficiency of other specified B group vitamins: Secondary | ICD-10-CM | POA: Diagnosis not present

## 2019-06-16 DIAGNOSIS — G709 Myoneural disorder, unspecified: Secondary | ICD-10-CM | POA: Insufficient documentation

## 2019-06-16 DIAGNOSIS — F419 Anxiety disorder, unspecified: Secondary | ICD-10-CM | POA: Insufficient documentation

## 2019-06-16 DIAGNOSIS — Z6835 Body mass index (BMI) 35.0-35.9, adult: Secondary | ICD-10-CM | POA: Insufficient documentation

## 2019-06-16 DIAGNOSIS — Z9049 Acquired absence of other specified parts of digestive tract: Secondary | ICD-10-CM | POA: Insufficient documentation

## 2019-06-16 DIAGNOSIS — Z7982 Long term (current) use of aspirin: Secondary | ICD-10-CM | POA: Insufficient documentation

## 2019-06-16 DIAGNOSIS — Z96651 Presence of right artificial knee joint: Secondary | ICD-10-CM

## 2019-06-16 DIAGNOSIS — Z8673 Personal history of transient ischemic attack (TIA), and cerebral infarction without residual deficits: Secondary | ICD-10-CM | POA: Insufficient documentation

## 2019-06-16 DIAGNOSIS — Z823 Family history of stroke: Secondary | ICD-10-CM | POA: Insufficient documentation

## 2019-06-16 DIAGNOSIS — E559 Vitamin D deficiency, unspecified: Secondary | ICD-10-CM | POA: Diagnosis not present

## 2019-06-16 DIAGNOSIS — E039 Hypothyroidism, unspecified: Secondary | ICD-10-CM | POA: Insufficient documentation

## 2019-06-16 DIAGNOSIS — R269 Unspecified abnormalities of gait and mobility: Secondary | ICD-10-CM | POA: Insufficient documentation

## 2019-06-16 DIAGNOSIS — E1122 Type 2 diabetes mellitus with diabetic chronic kidney disease: Secondary | ICD-10-CM | POA: Insufficient documentation

## 2019-06-16 DIAGNOSIS — E669 Obesity, unspecified: Secondary | ICD-10-CM | POA: Diagnosis not present

## 2019-06-16 DIAGNOSIS — Z888 Allergy status to other drugs, medicaments and biological substances status: Secondary | ICD-10-CM | POA: Insufficient documentation

## 2019-06-16 DIAGNOSIS — K2100 Gastro-esophageal reflux disease with esophagitis, without bleeding: Secondary | ICD-10-CM | POA: Diagnosis not present

## 2019-06-16 DIAGNOSIS — G20 Parkinson's disease: Secondary | ICD-10-CM | POA: Insufficient documentation

## 2019-06-16 DIAGNOSIS — Z886 Allergy status to analgesic agent status: Secondary | ICD-10-CM | POA: Insufficient documentation

## 2019-06-16 DIAGNOSIS — F17210 Nicotine dependence, cigarettes, uncomplicated: Secondary | ICD-10-CM | POA: Diagnosis not present

## 2019-06-16 DIAGNOSIS — N183 Chronic kidney disease, stage 3 unspecified: Secondary | ICD-10-CM | POA: Insufficient documentation

## 2019-06-16 DIAGNOSIS — Z96659 Presence of unspecified artificial knee joint: Secondary | ICD-10-CM

## 2019-06-16 DIAGNOSIS — Z8249 Family history of ischemic heart disease and other diseases of the circulatory system: Secondary | ICD-10-CM | POA: Insufficient documentation

## 2019-06-16 DIAGNOSIS — Z885 Allergy status to narcotic agent status: Secondary | ICD-10-CM | POA: Insufficient documentation

## 2019-06-16 HISTORY — DX: Presence of right artificial knee joint: Z96.651

## 2019-06-16 HISTORY — PX: TOTAL KNEE ARTHROPLASTY: SHX125

## 2019-06-16 LAB — HEMOGLOBIN A1C
Hgb A1c MFr Bld: 6.9 % — ABNORMAL HIGH (ref 4.8–5.6)
Mean Plasma Glucose: 151.33 mg/dL

## 2019-06-16 LAB — GLUCOSE, CAPILLARY
Glucose-Capillary: 207 mg/dL — ABNORMAL HIGH (ref 70–99)
Glucose-Capillary: 170 mg/dL — ABNORMAL HIGH (ref 70–99)
Glucose-Capillary: 179 mg/dL — ABNORMAL HIGH (ref 70–99)
Glucose-Capillary: 305 mg/dL — ABNORMAL HIGH (ref 70–99)
Glucose-Capillary: 292 mg/dL — ABNORMAL HIGH (ref 70–99)

## 2019-06-16 SURGERY — ARTHROPLASTY, KNEE, TOTAL
Anesthesia: Spinal | Site: Knee | Laterality: Right

## 2019-06-16 MED ORDER — AMLODIPINE BESYLATE 5 MG PO TABS
5.0000 mg | ORAL_TABLET | Freq: Two times a day (BID) | ORAL | Status: DC
  Administered 2019-06-16 – 2019-06-17 (×2): 5 mg via ORAL
  Filled 2019-06-16 (×2): qty 1

## 2019-06-16 MED ORDER — TRAMADOL HCL 50 MG PO TABS
50.0000 mg | ORAL_TABLET | Freq: Four times a day (QID) | ORAL | Status: DC
  Administered 2019-06-16 – 2019-06-17 (×4): 50 mg via ORAL
  Filled 2019-06-16 (×4): qty 1

## 2019-06-16 MED ORDER — METOCLOPRAMIDE HCL 5 MG PO TABS: 5.0000 mg | ORAL_TABLET | Freq: Three times a day (TID) | ORAL | Status: DC | PRN

## 2019-06-16 MED ORDER — ZOLPIDEM TARTRATE 5 MG PO TABS: 5.0000 mg | ORAL_TABLET | Freq: Every evening | ORAL | Status: DC | PRN

## 2019-06-16 MED ORDER — SODIUM CHLORIDE 0.9 % IR SOLN
Status: DC | PRN
  Administered 2019-06-16: 1000 mL

## 2019-06-16 MED ORDER — ACETAMINOPHEN 500 MG PO TABS
1000.0000 mg | ORAL_TABLET | Freq: Once | ORAL | Status: AC
  Administered 2019-06-16: 1000 mg via ORAL
  Filled 2019-06-16: qty 2

## 2019-06-16 MED ORDER — DEXAMETHASONE SODIUM PHOSPHATE 10 MG/ML IJ SOLN
INTRAMUSCULAR | Status: AC
  Filled 2019-06-16: qty 1

## 2019-06-16 MED ORDER — SODIUM CHLORIDE (PF) 0.9 % IJ SOLN
INTRAMUSCULAR | Status: AC
  Filled 2019-06-16: qty 20

## 2019-06-16 MED ORDER — OXYBUTYNIN CHLORIDE 5 MG PO TABS
5.0000 mg | ORAL_TABLET | Freq: Two times a day (BID) | ORAL | Status: DC
  Administered 2019-06-16 – 2019-06-17 (×2): 5 mg via ORAL
  Filled 2019-06-16 (×2): qty 1

## 2019-06-16 MED ORDER — BUPIVACAINE LIPOSOME 1.3 % IJ SUSP
INTRAMUSCULAR | Status: DC | PRN
  Administered 2019-06-16: 20 mL

## 2019-06-16 MED ORDER — OXYCODONE HCL 5 MG PO TABS
5.0000 mg | ORAL_TABLET | ORAL | Status: DC | PRN
  Administered 2019-06-16 – 2019-06-17 (×2): 10 mg via ORAL
  Filled 2019-06-16 (×2): qty 2

## 2019-06-16 MED ORDER — GABAPENTIN 300 MG PO CAPS
300.0000 mg | ORAL_CAPSULE | Freq: Once | ORAL | Status: AC
  Administered 2019-06-16: 300 mg via ORAL
  Filled 2019-06-16: qty 1

## 2019-06-16 MED ORDER — ALPRAZOLAM 0.5 MG PO TABS
0.5000 mg | ORAL_TABLET | Freq: Three times a day (TID) | ORAL | Status: DC | PRN
  Administered 2019-06-16: 1 mg via ORAL
  Administered 2019-06-17: 0.5 mg via ORAL
  Filled 2019-06-16: qty 2
  Filled 2019-06-16: qty 1

## 2019-06-16 MED ORDER — MENTHOL 3 MG MT LOZG: 1.0000 | LOZENGE | OROMUCOSAL | Status: DC | PRN

## 2019-06-16 MED ORDER — GABAPENTIN 300 MG PO CAPS
300.0000 mg | ORAL_CAPSULE | Freq: Three times a day (TID) | ORAL | Status: DC
  Administered 2019-06-16 – 2019-06-17 (×3): 300 mg via ORAL
  Filled 2019-06-16 (×3): qty 1

## 2019-06-16 MED ORDER — PROPOFOL 500 MG/50ML IV EMUL
INTRAVENOUS | Status: AC
  Filled 2019-06-16: qty 50

## 2019-06-16 MED ORDER — FENTANYL CITRATE (PF) 100 MCG/2ML IJ SOLN
INTRAMUSCULAR | Status: AC
  Administered 2019-06-16: 08:00:00 50 ug via INTRAVENOUS
  Filled 2019-06-16: qty 2

## 2019-06-16 MED ORDER — TRANEXAMIC ACID-NACL 1000-0.7 MG/100ML-% IV SOLN
1000.0000 mg | INTRAVENOUS | Status: AC
  Administered 2019-06-16: 1000 mg via INTRAVENOUS
  Filled 2019-06-16: qty 100

## 2019-06-16 MED ORDER — LEVOTHYROXINE SODIUM 100 MCG PO TABS
100.0000 ug | ORAL_TABLET | Freq: Every day | ORAL | Status: DC
  Administered 2019-06-17: 100 ug via ORAL
  Filled 2019-06-16: qty 1

## 2019-06-16 MED ORDER — DEXAMETHASONE SODIUM PHOSPHATE 10 MG/ML IJ SOLN: 10.0000 mg | Freq: Once | INTRAMUSCULAR | Status: DC

## 2019-06-16 MED ORDER — PROMETHAZINE HCL 25 MG/ML IJ SOLN: 6.2500 mg | INTRAMUSCULAR | Status: DC | PRN

## 2019-06-16 MED ORDER — ROPIVACAINE HCL 5 MG/ML IJ SOLN
INTRAMUSCULAR | Status: DC | PRN
  Administered 2019-06-16: 20 mL via PERINEURAL

## 2019-06-16 MED ORDER — DIPHENHYDRAMINE HCL 12.5 MG/5ML PO ELIX: 12.5000 mg | ORAL_SOLUTION | ORAL | Status: DC | PRN

## 2019-06-16 MED ORDER — BUPIVACAINE HCL (PF) 0.75 % IJ SOLN
INTRAMUSCULAR | Status: DC | PRN
  Administered 2019-06-16: 1.6 mL via INTRATHECAL

## 2019-06-16 MED ORDER — METHOCARBAMOL 500 MG PO TABS
500.0000 mg | ORAL_TABLET | Freq: Four times a day (QID) | ORAL | Status: DC | PRN
  Administered 2019-06-17: 500 mg via ORAL
  Filled 2019-06-16: qty 1

## 2019-06-16 MED ORDER — ONDANSETRON HCL 4 MG/2ML IJ SOLN
INTRAMUSCULAR | Status: AC
  Filled 2019-06-16: qty 2

## 2019-06-16 MED ORDER — PHENOL 1.4 % MT LIQD: 1.0000 | OROMUCOSAL | Status: DC | PRN

## 2019-06-16 MED ORDER — CELECOXIB 200 MG PO CAPS
400.0000 mg | ORAL_CAPSULE | Freq: Once | ORAL | Status: AC
  Administered 2019-06-16: 400 mg via ORAL
  Filled 2019-06-16: qty 2

## 2019-06-16 MED ORDER — PROPOFOL 500 MG/50ML IV EMUL
INTRAVENOUS | Status: DC | PRN
  Administered 2019-06-16: 100 ug/kg/min via INTRAVENOUS

## 2019-06-16 MED ORDER — PANTOPRAZOLE SODIUM 40 MG PO TBEC: 40.0000 mg | DELAYED_RELEASE_TABLET | Freq: Every day | ORAL | Status: DC

## 2019-06-16 MED ORDER — FLEET ENEMA 7-19 GM/118ML RE ENEM: 1.0000 | ENEMA | Freq: Once | RECTAL | Status: DC | PRN

## 2019-06-16 MED ORDER — SODIUM CHLORIDE 0.9 % IV SOLN
INTRAVENOUS | Status: DC
  Administered 2019-06-16: 12:00:00 via INTRAVENOUS

## 2019-06-16 MED ORDER — TRANEXAMIC ACID-NACL 1000-0.7 MG/100ML-% IV SOLN
1000.0000 mg | Freq: Once | INTRAVENOUS | Status: AC
  Administered 2019-06-16: 1000 mg via INTRAVENOUS
  Filled 2019-06-16: qty 100

## 2019-06-16 MED ORDER — INSULIN ASPART 100 UNIT/ML ~~LOC~~ SOLN
0.0000 [IU] | Freq: Every day | SUBCUTANEOUS | Status: DC
  Administered 2019-06-16: 3 [IU] via SUBCUTANEOUS

## 2019-06-16 MED ORDER — POVIDONE-IODINE 10 % EX SWAB
2.0000 "application " | Freq: Once | CUTANEOUS | Status: AC
  Administered 2019-06-16: 2 via TOPICAL

## 2019-06-16 MED ORDER — SODIUM CHLORIDE 0.9% FLUSH
INTRAVENOUS | Status: DC | PRN
  Administered 2019-06-16: 20 mL

## 2019-06-16 MED ORDER — LACTATED RINGERS IV SOLN
INTRAVENOUS | Status: DC
  Administered 2019-06-16 (×2): via INTRAVENOUS

## 2019-06-16 MED ORDER — DOCUSATE SODIUM 100 MG PO CAPS
100.0000 mg | ORAL_CAPSULE | Freq: Two times a day (BID) | ORAL | Status: DC
  Administered 2019-06-16 – 2019-06-17 (×3): 100 mg via ORAL
  Filled 2019-06-16 (×3): qty 1

## 2019-06-16 MED ORDER — BISACODYL 5 MG PO TBEC: 5.0000 mg | DELAYED_RELEASE_TABLET | Freq: Every day | ORAL | Status: DC | PRN

## 2019-06-16 MED ORDER — BUPIVACAINE-EPINEPHRINE 0.25% -1:200000 IJ SOLN
INTRAMUSCULAR | Status: AC
  Filled 2019-06-16: qty 1

## 2019-06-16 MED ORDER — HYDROMORPHONE HCL 1 MG/ML IJ SOLN: 0.5000 mg | INTRAMUSCULAR | Status: DC | PRN

## 2019-06-16 MED ORDER — FERROUS SULFATE 325 (65 FE) MG PO TABS
325.0000 mg | ORAL_TABLET | Freq: Three times a day (TID) | ORAL | Status: DC
  Administered 2019-06-16 – 2019-06-17 (×3): 325 mg via ORAL
  Filled 2019-06-16 (×3): qty 1

## 2019-06-16 MED ORDER — METHOCARBAMOL 500 MG IVPB - SIMPLE MED
500.0000 mg | Freq: Four times a day (QID) | INTRAVENOUS | Status: DC | PRN
  Filled 2019-06-16: qty 50

## 2019-06-16 MED ORDER — ACETAMINOPHEN 500 MG PO TABS
1000.0000 mg | ORAL_TABLET | Freq: Four times a day (QID) | ORAL | Status: AC
  Administered 2019-06-16 – 2019-06-17 (×4): 1000 mg via ORAL
  Filled 2019-06-16 (×5): qty 2

## 2019-06-16 MED ORDER — FUROSEMIDE 40 MG PO TABS
40.0000 mg | ORAL_TABLET | Freq: Every day | ORAL | Status: DC
  Administered 2019-06-17: 40 mg via ORAL
  Filled 2019-06-16: qty 1

## 2019-06-16 MED ORDER — INSULIN ASPART PROT & ASPART (70-30 MIX) 100 UNIT/ML ~~LOC~~ SUSP
35.0000 [IU] | Freq: Two times a day (BID) | SUBCUTANEOUS | Status: DC
  Administered 2019-06-16 – 2019-06-17 (×2): 35 [IU] via SUBCUTANEOUS
  Filled 2019-06-16: qty 10

## 2019-06-16 MED ORDER — EPHEDRINE SULFATE-NACL 50-0.9 MG/10ML-% IV SOSY
PREFILLED_SYRINGE | INTRAVENOUS | Status: DC | PRN
  Administered 2019-06-16 (×2): 7.5 mg via INTRAVENOUS

## 2019-06-16 MED ORDER — INSULIN ASPART 100 UNIT/ML ~~LOC~~ SOLN
0.0000 [IU] | Freq: Three times a day (TID) | SUBCUTANEOUS | Status: DC
  Administered 2019-06-16: 11 [IU] via SUBCUTANEOUS
  Administered 2019-06-16 – 2019-06-17 (×3): 3 [IU] via SUBCUTANEOUS

## 2019-06-16 MED ORDER — MIDAZOLAM HCL 2 MG/2ML IJ SOLN
INTRAMUSCULAR | Status: AC
  Administered 2019-06-16: 1 mg via INTRAVENOUS
  Filled 2019-06-16: qty 2

## 2019-06-16 MED ORDER — CEFAZOLIN SODIUM-DEXTROSE 2-4 GM/100ML-% IV SOLN
2.0000 g | INTRAVENOUS | Status: AC
  Administered 2019-06-16: 2 g via INTRAVENOUS
  Filled 2019-06-16: qty 100

## 2019-06-16 MED ORDER — ONDANSETRON HCL 4 MG/2ML IJ SOLN: 4.0000 mg | Freq: Four times a day (QID) | INTRAMUSCULAR | Status: DC | PRN

## 2019-06-16 MED ORDER — PRAVASTATIN SODIUM 20 MG PO TABS
20.0000 mg | ORAL_TABLET | Freq: Every day | ORAL | Status: DC
  Administered 2019-06-16 – 2019-06-17 (×2): 20 mg via ORAL
  Filled 2019-06-16 (×2): qty 1

## 2019-06-16 MED ORDER — ASPIRIN EC 325 MG PO TBEC
325.0000 mg | DELAYED_RELEASE_TABLET | Freq: Two times a day (BID) | ORAL | Status: DC
  Administered 2019-06-17: 325 mg via ORAL
  Filled 2019-06-16: qty 1

## 2019-06-16 MED ORDER — FENTANYL CITRATE (PF) 100 MCG/2ML IJ SOLN: 25.0000 ug | INTRAMUSCULAR | Status: DC | PRN

## 2019-06-16 MED ORDER — PRAMIPEXOLE DIHYDROCHLORIDE 0.25 MG PO TABS
0.5000 mg | ORAL_TABLET | Freq: Three times a day (TID) | ORAL | Status: DC
  Administered 2019-06-16 – 2019-06-17 (×3): 0.5 mg via ORAL
  Filled 2019-06-16 (×3): qty 2

## 2019-06-16 MED ORDER — BUPIVACAINE-EPINEPHRINE 0.25% -1:200000 IJ SOLN
INTRAMUSCULAR | Status: DC | PRN
  Administered 2019-06-16: 30 mL

## 2019-06-16 MED ORDER — FENTANYL CITRATE (PF) 100 MCG/2ML IJ SOLN
50.0000 ug | INTRAMUSCULAR | Status: DC
  Administered 2019-06-16: 08:00:00 50 ug via INTRAVENOUS

## 2019-06-16 MED ORDER — PANTOPRAZOLE SODIUM 40 MG PO TBEC
40.0000 mg | DELAYED_RELEASE_TABLET | Freq: Every day | ORAL | Status: DC
  Administered 2019-06-16 – 2019-06-17 (×2): 40 mg via ORAL
  Filled 2019-06-16 (×2): qty 1

## 2019-06-16 MED ORDER — WATER FOR IRRIGATION, STERILE IR SOLN
Status: DC | PRN
  Administered 2019-06-16: 2000 mL

## 2019-06-16 MED ORDER — SENNOSIDES-DOCUSATE SODIUM 8.6-50 MG PO TABS: 1.0000 | ORAL_TABLET | Freq: Every evening | ORAL | Status: DC | PRN

## 2019-06-16 MED ORDER — DEXAMETHASONE SODIUM PHOSPHATE 10 MG/ML IJ SOLN
8.0000 mg | Freq: Once | INTRAMUSCULAR | Status: AC
  Administered 2019-06-16: 8 mg via INTRAVENOUS

## 2019-06-16 MED ORDER — LOSARTAN POTASSIUM 50 MG PO TABS
100.0000 mg | ORAL_TABLET | Freq: Every day | ORAL | Status: DC
  Administered 2019-06-16 – 2019-06-17 (×2): 100 mg via ORAL
  Filled 2019-06-16 (×2): qty 2

## 2019-06-16 MED ORDER — CEFAZOLIN SODIUM-DEXTROSE 2-4 GM/100ML-% IV SOLN
2.0000 g | Freq: Four times a day (QID) | INTRAVENOUS | Status: AC
  Administered 2019-06-16 (×2): 2 g via INTRAVENOUS
  Filled 2019-06-16 (×2): qty 100

## 2019-06-16 MED ORDER — METOCLOPRAMIDE HCL 5 MG/ML IJ SOLN: 5.0000 mg | Freq: Three times a day (TID) | INTRAMUSCULAR | Status: DC | PRN

## 2019-06-16 MED ORDER — ALUM & MAG HYDROXIDE-SIMETH 200-200-20 MG/5ML PO SUSP: 30.0000 mL | ORAL | Status: DC | PRN

## 2019-06-16 MED ORDER — CITALOPRAM HYDROBROMIDE 20 MG PO TABS
20.0000 mg | ORAL_TABLET | Freq: Every day | ORAL | Status: DC
  Administered 2019-06-17: 20 mg via ORAL
  Filled 2019-06-16: qty 1

## 2019-06-16 MED ORDER — ONDANSETRON HCL 4 MG PO TABS: 4.0000 mg | ORAL_TABLET | Freq: Four times a day (QID) | ORAL | Status: DC | PRN

## 2019-06-16 MED ORDER — MIDAZOLAM HCL 2 MG/2ML IJ SOLN
1.0000 mg | INTRAMUSCULAR | Status: DC
  Administered 2019-06-16: 08:00:00 1 mg via INTRAVENOUS

## 2019-06-16 MED ORDER — PROPOFOL 10 MG/ML IV BOLUS
INTRAVENOUS | Status: AC
  Filled 2019-06-16: qty 20

## 2019-06-16 MED ORDER — PROPOFOL 10 MG/ML IV BOLUS
INTRAVENOUS | Status: DC | PRN
  Administered 2019-06-16: 30 mg via INTRAVENOUS

## 2019-06-16 MED ORDER — EPHEDRINE 5 MG/ML INJ
INTRAVENOUS | Status: AC
  Filled 2019-06-16: qty 10

## 2019-06-16 SURGICAL SUPPLY — 59 items
ARTISURF 10M PLY R 6-9CD KNEE (Knees) ×3 IMPLANT
BAG ZIPLOCK 12X15 (MISCELLANEOUS) ×3 IMPLANT
BLADE SAGITTAL 13X1.27X60 (BLADE) ×2 IMPLANT
BLADE SAGITTAL 13X1.27X60MM (BLADE) ×1
BLADE SAW SGTL 83.5X18.5 (BLADE) ×3 IMPLANT
BLADE SURG 15 STRL LF DISP TIS (BLADE) ×1 IMPLANT
BLADE SURG 15 STRL SS (BLADE) ×2
BLADE SURG SZ10 CARB STEEL (BLADE) ×6 IMPLANT
BNDG ELASTIC 6X10 VLCR STRL LF (GAUZE/BANDAGES/DRESSINGS) ×3 IMPLANT
BNDG ELASTIC 6X5.8 VLCR STR LF (GAUZE/BANDAGES/DRESSINGS) ×3 IMPLANT
BOWL SMART MIX CTS (DISPOSABLE) ×3 IMPLANT
CEMENT BONE SIMPLEX SPEEDSET (Cement) ×6 IMPLANT
CLOSURE WOUND 1/2 X4 (GAUZE/BANDAGES/DRESSINGS) ×1
COMP FEM PERSONA SZ7 RT (Joint) ×3 IMPLANT
COMPONENT FEM PERSONA SZ7 RT (Joint) ×1 IMPLANT
COVER SURGICAL LIGHT HANDLE (MISCELLANEOUS) ×3 IMPLANT
COVER WAND RF STERILE (DRAPES) IMPLANT
CUFF TOURN SGL QUICK 34 (TOURNIQUET CUFF) ×2
CUFF TRNQT CYL 34X4.125X (TOURNIQUET CUFF) ×1 IMPLANT
DECANTER SPIKE VIAL GLASS SM (MISCELLANEOUS) ×6 IMPLANT
DRAPE INCISE IOBAN 66X45 STRL (DRAPES) ×6 IMPLANT
DRAPE U-SHAPE 47X51 STRL (DRAPES) ×3 IMPLANT
DRSG AQUACEL AG ADV 3.5X10 (GAUZE/BANDAGES/DRESSINGS) ×3 IMPLANT
DURAPREP 26ML APPLICATOR (WOUND CARE) ×6 IMPLANT
ELECT REM PT RETURN 15FT ADLT (MISCELLANEOUS) ×3 IMPLANT
GLOVE BIOGEL M STRL SZ7.5 (GLOVE) ×3 IMPLANT
GLOVE BIOGEL PI IND STRL 7.5 (GLOVE) ×1 IMPLANT
GLOVE BIOGEL PI IND STRL 8.5 (GLOVE) ×2 IMPLANT
GLOVE BIOGEL PI INDICATOR 7.5 (GLOVE) ×2
GLOVE BIOGEL PI INDICATOR 8.5 (GLOVE) ×4
GLOVE SURG ORTHO 8.0 STRL STRW (GLOVE) ×9 IMPLANT
GOWN STRL REUS W/ TWL XL LVL3 (GOWN DISPOSABLE) ×2 IMPLANT
GOWN STRL REUS W/TWL XL LVL3 (GOWN DISPOSABLE) ×4
HANDPIECE INTERPULSE COAX TIP (DISPOSABLE) ×2
HOLDER FOLEY CATH W/STRAP (MISCELLANEOUS) ×3 IMPLANT
HOOD PEEL AWAY FLYTE STAYCOOL (MISCELLANEOUS) ×9 IMPLANT
KIT TURNOVER KIT A (KITS) IMPLANT
MANIFOLD NEPTUNE II (INSTRUMENTS) ×3 IMPLANT
NEEDLE HYPO 22GX1.5 SAFETY (NEEDLE) ×3 IMPLANT
NS IRRIG 1000ML POUR BTL (IV SOLUTION) ×3 IMPLANT
PACK TOTAL KNEE CUSTOM (KITS) ×3 IMPLANT
PROTECTOR NERVE ULNAR (MISCELLANEOUS) ×3 IMPLANT
SET HNDPC FAN SPRY TIP SCT (DISPOSABLE) ×1 IMPLANT
STEM POLY PAT PLY 32M KNEE (Knees) ×3 IMPLANT
STEM TIBIA 5 DEG SZ D R KNEE (Knees) ×1 IMPLANT
STRIP CLOSURE SKIN 1/2X4 (GAUZE/BANDAGES/DRESSINGS) ×2 IMPLANT
SUT BONE WAX W31G (SUTURE) ×3 IMPLANT
SUT MNCRL AB 3-0 PS2 18 (SUTURE) ×3 IMPLANT
SUT STRATAFIX 0 PDS 27 VIOLET (SUTURE) ×3
SUT STRATAFIX PDS+ 0 24IN (SUTURE) ×3 IMPLANT
SUT VIC AB 1 CT1 36 (SUTURE) ×3 IMPLANT
SUTURE STRATFX 0 PDS 27 VIOLET (SUTURE) ×1 IMPLANT
SYR CONTROL 10ML LL (SYRINGE) ×6 IMPLANT
TIBIA STEM 5 DEG SZ D R KNEE (Knees) ×3 IMPLANT
TRAY FOLEY MTR SLVR 16FR STAT (SET/KITS/TRAYS/PACK) ×3 IMPLANT
TROCAR PINS ×3 IMPLANT
WATER STERILE IRR 1000ML POUR (IV SOLUTION) ×6 IMPLANT
WRAP KNEE MAXI GEL POST OP (GAUZE/BANDAGES/DRESSINGS) ×3 IMPLANT
YANKAUER SUCT BULB TIP 10FT TU (MISCELLANEOUS) ×3 IMPLANT

## 2019-06-16 NOTE — Op Note (Signed)
TOTAL KNEE REPLACEMENT OPERATIVE NOTE:  06/16/2019  11:08 AM  PATIENT:  Kayla Horne  73 y.o. female  PRE-OPERATIVE DIAGNOSIS:  Primary Osteoarthritis Right Knee  POST-OPERATIVE DIAGNOSIS:  Primary Osteoarthritis Right Knee  PROCEDURE:  Procedure(s): TOTAL KNEE ARTHROPLASTY  SURGEON:  Surgeon(s): Dannielle Huh, MD  PHYSICIAN ASSISTANT: Laurier Nancy, PA-C   ANESTHESIA:   spinal  SPECIMEN: None  COUNTS:  Correct  TOURNIQUET:   Total Tourniquet Time Documented: Thigh (Right) - 36 minutes Total: Thigh (Right) - 36 minutes   DICTATION:  Indication for procedure:    The patient is a 73 y.o. female who has failed conservative treatment for Primary Osteoarthritis Right Knee.  Informed consent was obtained prior to anesthesia. The risks versus benefits of the operation were explain and in a way the patient can, and did, understand.    Description of procedure:     The patient was taken to the operating room and placed under anesthesia.  The patient was positioned in the usual fashion taking care that all body parts were adequately padded and/or protected.  A tourniquet was applied and the leg prepped and draped in the usual sterile fashion.  The extremity was exsanguinated with the esmarch and tourniquet inflated to 350 mmHg.  Pre-operative range of motion was normal.    A midline incision approximately 6-7 inches long was made with a #10 blade.  A new blade was used to make a parapatellar arthrotomy going 2-3 cm into the quadriceps tendon, over the patella, and alongside the medial aspect of the patellar tendon.  A synovectomy was then performed with the #10 blade and forceps. I then elevated the deep MCL off the medial tibial metaphysis subperiosteally around to the semimembranosus attachment.    I everted the patella and used calipers to measure patellar thickness.  I used the reamer to ream down to appropriate thickness to recreate the native thickness.  I then removed  excess bone with the rongeur and sagittal saw.  I used the appropriately sized template and drilled the three lug holes.  I then put the trial in place and measured the thickness with the calipers to ensure recreation of the native thickness.  The trial was then removed and the patella subluxed and the knee brought into flexion.  A homan retractor was place to retract and protect the patella and lateral structures.  A Z-retractor was place medially to protect the medial structures.  The extra-medullary alignment system was used to make cut the tibial articular surface perpendicular to the anamotic axis of the tibia and in 3 degrees of posterior slope.  The cut surface and alignment jig was removed.  I then used the intramedullary alignment guide to make a  valgus cut on the distal femur.  I then marked out the epicondylar axis on the distal femur.    I then used the anterior referencing sizer and measured the femur to be a size 7.  The 4-In-1 cutting block was screwed into place in external rotation matching the posterior condylar angle, making our cuts perpendicular to the epicondylar axis.  Anterior, posterior and chamfer cuts were made with the sagittal saw.  The cutting block and cut pieces were removed.  A lamina spreader was placed in 90 degrees of flexion.  The ACL, PCL, menisci, and posterior condylar osteophytes were removed.  A 10 mm spacer blocked was found to offer good flexion and extension gap balance after minimal in degree releasing.   The scoop retractor was then placed  and the femoral finishing block was pinned in place.  The small sagittal saw was used as well as the lug drill to finish the femur.  The block and cut surfaces were removed and the medullary canal hole filled with autograft bone from the cut pieces.  The tibia was delivered forward in deep flexion and external rotation.  A size D tray was selected and pinned into place centered on the medial 1/3 of the tibial tubercle.  The  reamer and keel was used to prepare the tibia through the tray.    I then trialed with the size 7 femur, size D tibia, a 10 mm insert and the 32 patella.  I had excellent flexion/extension gap balance, excellent patella tracking.  Flexion was full and beyond 120 degrees; extension was zero.  These components were chosen and the staff opened them to me on the back table while the knee was lavaged copiously and the cement mixed.  The soft tissue was infiltrated with 60cc of exparel 1.3% through a 21 gauge needle.  I cemented in the components and removed all excess cement.  The polyethylene tibial component was snapped into place and the knee placed in extension while cement was hardening.  The capsule was infilltrated with a 60cc exparel/marcaine/saline mixture.   Once the cement was hard, the tourniquet was let down.  Hemostasis was obtained.  The arthrotomy was closed using a #1 stratofix running suture.  The deep soft tissues were closed with #0 vicryls and the subcuticular layer closed with #2-0 vicryl.  The skin was reapproximated and closed with 3.0 Monocryl.  The wound was covered with steristrips, aquacel dressing, and a TED stocking.   The patient was then awakened, extubated, and taken to the recovery room in stable condition.  BLOOD LOSS:  425ZD COMPLICATIONS:  None.  PLAN OF CARE: Admit for overnight observation  PATIENT DISPOSITION:  PACU - hemodynamically stable.    Please fax a copy of this op note to my office at [phone] (please only include page 1 and 2 of the Case Information op note)

## 2019-06-16 NOTE — Transfer of Care (Signed)
Immediate Anesthesia Transfer of Care Note  Patient: Kayla Horne  Procedure(s) Performed: TOTAL KNEE ARTHROPLASTY (Right Knee)  Patient Location: PACU  Anesthesia Type:Spinal  Level of Consciousness: awake, alert , oriented and patient cooperative  Airway & Oxygen Therapy: Patient Spontanous Breathing and Patient connected to face mask  Post-op Assessment: Report given to RN and Post -op Vital signs reviewed and stable  Post vital signs: Reviewed and stable  Last Vitals:  Vitals Value Taken Time  BP    Temp    Pulse 71 06/16/19 1052  Resp 19 06/16/19 1052  SpO2 100 % 06/16/19 1052  Vitals shown include unvalidated device data.  Last Pain:  Vitals:   06/16/19 0900  TempSrc:   PainSc: 0-No pain         Complications: No apparent anesthesia complications

## 2019-06-16 NOTE — Progress Notes (Signed)
AssistedDr. Rob Fitzgerald with right, ultrasound guided, adductor canal block. Side rails up, monitors on throughout procedure. See vital signs in flow sheet. Tolerated Procedure well.  

## 2019-06-16 NOTE — H&P (Signed)
DORALENE GLANZ MRN:  [id number] DOB/SEX:  02-22-1946/female  CHIEF COMPLAINT:  Painful right Knee  HISTORY: Patient is a 73 y.o. female presented with a history of pain in the right knee. Onset of symptoms was gradual starting a few years ago with gradually worsening course since that time. Patient has been treated conservatively with over-the-counter NSAIDs and activity modification. Patient currently rates pain in the knee at 10 out of 10 with activity. There is pain at night.  PAST MEDICAL HISTORY: Patient Active Problem List   Diagnosis Date Noted  . Diabetes mellitus due to underlying condition with unspecified complications (HCC) 12/25/2018  . Bradycardia 11/14/2018  . Dysphagia 11/14/2018  . Primary osteoarthritis of right shoulder 09/05/2018  . CKD stage 3 secondary to diabetes (HCC) 06/27/2018  . Chronic bronchitis (HCC) 05/03/2018  . Pain in left knee 04/11/2018  . Localized edema 03/18/2018  . Contracture of both Achilles tendons 10/29/2017  . Metatarsalgia of both feet 10/29/2017  . 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 Vitamin B12 deficiency 03/01/2017  . Vitamin D deficiency 03/01/2017  . Callus of foot 11/13/2016  . Onychomycosis due to dermatophyte 11/13/2016  . Laceration of scalp 09/06/2016  . Neck mass 09/06/2016  . 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 Abnormal gait 09/16/2015  . Acquired hypothyroidism 09/16/2015  . Anxiety disorder 09/16/2015  . Arthritis 09/16/2015  . Essential hypertension 09/16/2015  . Essential tremor 09/16/2015  . Neuralgia 09/16/2015  . Hallux valgus 09/16/2015  . High risk medication use 09/16/2015  . Hyperlipidemia 09/16/2015  . Myalgia 09/16/2015  . Obesity 09/16/2015  . Polyneuropathy 09/16/2015  . Recurrent major depressive disorder (HCC) 09/16/2015  . Tremor 09/09/2015  . Parkinsonism (HCC) 09/09/2015  . Restless leg syndrome 09/09/2015  . DEGENERATIVE DISC  DISEASE 10/12/2008  . HEMOPTYSIS 10/12/2008  . DIABETES, TYPE 2 02/16/2008  . Esophageal reflux 02/11/2008  . COUGH 02/11/2008   Past Medical History:  Diagnosis Date  . Anxiety   . DEGENERATIVE DISC DISEASE 10/12/2008   Qualifier: Diagnosis of  By: Maple Hudson MD, Clinton D   . Depression   . DIABETES, TYPE 2 02/16/2008   Qualifier: Diagnosis of  By: Maple Hudson MD, Clinton D   . Esophageal reflux 02/11/2008   Centricity Description: GASTROESOPHAGEAL REFLUX DISEASE, CHRONIC Qualifier: Diagnosis of  By: Maple Hudson MD, Rennis Chris  Centricity Description: Gean Maidens D Qualifier: Diagnosis of  By: Maple Hudson MD, Clinton D   . Hemoptysis 10/12/2008   Qualifier: Diagnosis of  By: Maple Hudson MD, Clinton D   . Hypertension   . Hypothyroidism   . Parkinsonism (HCC) 09/09/2015  . Restless leg syndrome 09/09/2015  . Tremor 09/09/2015   Past Surgical History:  Procedure Laterality Date  . ABDOMINAL HYSTERECTOMY    . BREAST REDUCTION SURGERY    . CERVICAL SPINE SURGERY    . CHOLECYSTECTOMY    . KNEE SURGERY     Arthroscopic right  . LUMBAR DISC SURGERY  2017   x 2     MEDICATIONS:   No medications prior to admission.    ALLERGIES:   Allergies  Allergen Reactions  . Aspirin Other (See Comments)    dyspepsia  . Codeine Sulfate Nausea Only  . Morphine Nausea And Vomiting  . Latex Rash  . Tape Rash    REVIEW OF SYSTEMS:  A comprehensive review of systems was negative except for: Musculoskeletal: positive for arthralgias and bone pain   FAMILY HISTORY:   Family History  Problem Relation Age of Onset  . Stroke Father 81  . CAD Father   . CAD Brother 74       CABG  . CAD Brother 75       CABG  . Restless legs syndrome Mother   . Tremor Neg Hx     SOCIAL HISTORY:   Social History   Tobacco Use  . Smoking status: Current Some Day Smoker    Packs/day: 0.25    Types: Cigarettes  . Smokeless tobacco: Never Used  . Tobacco comment: 5 -7 cigarettes a week  Substance Use Topics  . Alcohol use: No      EXAMINATION:  Vital signs in last 24 hours:    There were no vitals taken for this visit.  General Appearance:    Alert, cooperative, no distress, appears stated age  Head:    Normocephalic, without obvious abnormality, atraumatic  Eyes:    PERRL, conjunctiva/corneas clear, EOM's intact, fundi    benign, both eyes  Ears:    Normal TM's and external ear canals, both ears  Nose:   Nares normal, septum midline, mucosa normal, no drainage    or sinus tenderness  Throat:   Lips, mucosa, and tongue normal; teeth and gums normal  Neck:   Supple, symmetrical, trachea midline, no adenopathy;    thyroid:  no enlargement/tenderness/nodules; no carotid   bruit or JVD  Back:     Symmetric, no curvature, ROM normal, no CVA tenderness  Lungs:     Clear to auscultation bilaterally, respirations unlabored  Chest Wall:    No tenderness or deformity   Heart:    Regular rate and rhythm, S1 and S2 normal, no murmur, rub   or gallop  Breast Exam:    No tenderness, masses, or nipple abnormality  Abdomen:     Soft, non-tender, bowel sounds active all four quadrants,    no masses, no organomegaly  Genitalia:    Normal female without lesion, discharge or tenderness  Rectal:    Normal tone,  no masses or tenderness;   guaiac negative stool  Extremities:   Extremities normal, atraumatic, no cyanosis or edema  Pulses:   2+ and symmetric all extremities  Skin:   Skin color, texture, turgor normal, no rashes or lesions  Lymph nodes:   Cervical, supraclavicular, and axillary nodes normal  Neurologic:   CNII-XII intact, normal strength, sensation and reflexes    throughout    Musculoskeletal:  ROM 0-120, Ligaments intact,  Imaging Review Plain radiographs demonstrate severe degenerative joint disease of the right knee. The overall alignment is neutral. The bone quality appears to be good for age and reported activity level.  Assessment/Plan: Primary osteoarthritis, right knee   The patient history,  physical examination and imaging studies are consistent with advanced degenerative joint disease of the right knee. The patient has failed conservative treatment.  The clearance notes were reviewed.  After discussion with the patient it was felt that Total Knee Replacement was indicated. The procedure,  risks, and benefits of total knee arthroplasty were presented and reviewed. The risks including but not limited to aseptic loosening, infection, blood clots, vascular injury, stiffness, patella tracking problems complications among others were discussed. The patient acknowledged the explanation, agreed to proceed with the plan.  Preoperative templating of the joint replacement has been completed, documented, and submitted to the Operating Room personnel in order to optimize intra-operative equipment management.    Patient's anticipated LOS is less than 2 midnights, meeting these requirements: -  Lives within 1 hour of care - Has a competent adult at home to recover with post-op recover - NO history of  - Chronic pain requiring opiods  - Diabetes  - Coronary Artery Disease  - Heart failure  - Heart attack  - Stroke  - DVT/VTE  - Cardiac arrhythmia  - Respiratory Failure/COPD  - Renal failure  - Anemia  - Advanced Liver disease       Guy SandiferColby Alan Robbins 06/16/2019, 6:23 AM

## 2019-06-16 NOTE — Anesthesia Preprocedure Evaluation (Signed)
Anesthesia Evaluation  Patient identified by MRN, date of birth, ID band Patient awake    Reviewed: Allergy & Precautions, NPO status , Patient's Chart, lab work & pertinent test results  Airway Mallampati: II  TM Distance: >3 FB Neck ROM: Full    Dental  (+) Dental Advisory Given   Pulmonary Current Smoker and Patient abstained from smoking.,    breath sounds clear to auscultation       Cardiovascular hypertension, Pt. on medications  Rhythm:Regular Rate:Bradycardia     Neuro/Psych  Neuromuscular disease    GI/Hepatic Neg liver ROS, GERD  ,  Endo/Other  diabetes, Type 2, Insulin DependentHypothyroidism   Renal/GU Renal InsufficiencyRenal disease     Musculoskeletal  (+) Arthritis ,   Abdominal   Peds  Hematology negative hematology ROS (+)   Anesthesia Other Findings   Reproductive/Obstetrics                             Lab Results  Component Value Date   WBC 8.2 06/13/2019   HGB 14.1 06/13/2019   HCT 43.6 06/13/2019   MCV 92.4 06/13/2019   PLT 208 06/13/2019   Lab Results  Component Value Date   CREATININE 0.94 06/13/2019   BUN 22 06/13/2019   NA 140 06/13/2019   K 4.2 06/13/2019   CL 107 06/13/2019   CO2 25 06/13/2019    Anesthesia Physical Anesthesia Plan  ASA: III  Anesthesia Plan: Spinal   Post-op Pain Management:  Regional for Post-op pain   Induction: Intravenous  PONV Risk Score and Plan: 1 and Propofol infusion, Ondansetron and Treatment may vary due to age or medical condition  Airway Management Planned: Simple Face Mask and Natural Airway  Additional Equipment:   Intra-op Plan:   Post-operative Plan:   Informed Consent: I have reviewed the patients History and Physical, chart, labs and discussed the procedure including the risks, benefits and alternatives for the proposed anesthesia with the patient or authorized representative who has indicated  his/her understanding and acceptance.       Plan Discussed with: CRNA  Anesthesia Plan Comments:         Anesthesia Quick Evaluation

## 2019-06-16 NOTE — Evaluation (Signed)
Physical Therapy Evaluation Patient Details Name: Kayla Horne MRN: [id number] DOB: Oct 25, 1945 Today's Date: 06/16/2019   History of Present Illness  Patient is 73 y.o. female s/p Rt TKA on 06/16/19 with PMH significant for HTN, DM, and Parkinsons.  Clinical Impression  Kayla Horne is a 73 y.o. female POD 0 s/p Rt TKA. Patient reports modified independence with intermittent use of SPC for mobility at baseline. Patient is now limited by functional impairments (see PT problem list below) and requires min assist for transfers and gait with RW. Patient was able to ambulate ~75 feet with RW and min assist to manage RW. Patient instructed in exercise to facilitate ROM and circulation. Patient will benefit from continued skilled PT interventions to address impairments and progress towards PLOF. Acute PT will follow to progress mobility and stair training in preparation for safe discharge home.    Follow Up Recommendations Follow surgeon's recommendation for DC plan and follow-up therapies    Equipment Recommendations  None recommended by PT    Recommendations for Other Services       Precautions / Restrictions Precautions Precautions: Fall Restrictions Weight Bearing Restrictions: No      Mobility  Bed Mobility Overal bed mobility: Needs Assistance Bed Mobility: Supine to Sit     Supine to sit: Min assist     General bed mobility comments: cues for use of bed rail and assist to bring LE's around  Transfers Overall transfer level: Needs assistance Equipment used: Rolling walker (2 wheeled) Transfers: Sit to/from Stand Sit to Stand: Min assist         General transfer comment: cues for safe hand placement and technique with RW  Ambulation/Gait Ambulation/Gait assistance: Min assist Gait Distance (Feet): 75 Feet Assistive device: Rolling walker (2 wheeled) Gait Pattern/deviations: Step-through pattern;Decreased stance time - right;Decreased step length -  left;Decreased stride length Gait velocity: decreased   General Gait Details: cues required for step pattern and safe hand placement on RW, cues for posture and to maintain safe proximity within [employer] Mobility    Modified Rankin (Stroke Patients Only)       Balance Overall balance assessment: Needs assistance Sitting-balance support: Feet supported;No upper extremity supported Sitting balance-Leahy Scale: Good     Standing balance support: During functional activity;Bilateral upper extremity supported Standing balance-Leahy Scale: Fair                Pertinent Vitals/Pain Pain Assessment: 0-10 Pain Score: 3  Pain Location: Rt knee Pain Descriptors / Indicators: Aching;Sore Pain Intervention(s): Limited activity within patient's tolerance;Monitored during session;Ice applied;Repositioned    Home Living Family/patient expects to be discharged to:: Private residence Living Arrangements: Children(pt's son) Available Help at Discharge: Family;Available 24 hours/day Type of Home: House Home Access: Stairs to enter Entrance Stairs-Rails: None Entrance Stairs-Number of Steps: 1 step up, no rails Home Layout: One level Home Equipment: Cane - single point;Bedside commode;Walker - 2 wheels      Prior Function Level of Independence: Independent with assistive device(s);Needs assistance   Gait / Transfers Assistance Needed: pt uses SPC to mobilize  ADL's / Homemaking Assistance Needed: pt needs assistance from son for lower body dressing and for cooking, cleaning        Hand Dominance   Dominant Hand: Right    Extremity/Trunk Assessment   Upper Extremity Assessment Upper Extremity Assessment: Overall WFL for tasks assessed    Lower Extremity Assessment Lower Extremity Assessment:  Generalized weakness;RLE deficits/detail RLE Deficits / Details: pt with good quad activation in supine and no extensor lag with SLR, 4/5 for quad  strength with MMT RLE Sensation: WNL RLE Coordination: WNL    Cervical / Trunk Assessment Cervical / Trunk Assessment: Normal  Communication   Communication: No difficulties  Cognition Arousal/Alertness: Awake/alert Behavior During Therapy: WFL for tasks assessed/performed Overall Cognitive Status: Within Functional Limits for tasks assessed             General Comments      Exercises Total Joint Exercises Ankle Circles/Pumps: AROM;Seated;Both;15 reps Quad Sets: AROM;10 reps;Seated;Right Heel Slides: AROM;10 reps;Seated;Right   Assessment/Plan    PT Assessment Patient needs continued PT services  PT Problem List Decreased strength;Decreased range of motion;Decreased activity tolerance;Decreased mobility;Decreased knowledge of use of DME;Decreased balance       PT Treatment Interventions DME instruction;Functional mobility training;Therapeutic activities;Gait training;Stair training;Therapeutic exercise;Balance training;Patient/family education;Modalities    PT Goals (Current goals can be found in the Care Plan section)  Acute Rehab PT Goals Patient Stated Goal: to return home and get moving better PT Goal Formulation: With patient Time For Goal Achievement: 06/23/19 Potential to Achieve Goals: Good    Frequency 7X/week    AM-PAC PT "6 Clicks" Mobility  Outcome Measure Help needed turning from your back to your side while in a flat bed without using bedrails?: A Little Help needed moving from lying on your back to sitting on the side of a flat bed without using bedrails?: A Little Help needed moving to and from a bed to a chair (including a wheelchair)?: A Little Help needed standing up from a chair using your arms (e.g., wheelchair or bedside chair)?: A Little Help needed to walk in hospital room?: A Little Help needed climbing 3-5 steps with a railing? : A Little 6 Click Score: 18    End of Session Equipment Utilized During Treatment: Gait belt Activity  Tolerance: Patient tolerated treatment well Patient left: in chair;with call bell/phone within reach;with chair alarm set Nurse Communication: Mobility status PT Visit Diagnosis: Muscle weakness (generalized) (M62.81);Difficulty in walking, not elsewhere classified (R26.2)    Time: 1601-0932 PT Time Calculation (min) (ACUTE ONLY): 32 min   Charges:   PT Evaluation $PT Eval Low Complexity: 1 Low PT Treatments $Therapeutic Exercise: 8-22 mins        Valentino Saxon, PT, DPT Physical Therapist with [facility]  06/16/2019 3:49 PM

## 2019-06-16 NOTE — Anesthesia Procedure Notes (Signed)
Anesthesia Regional Block: Adductor canal block   Pre-Anesthetic Checklist: ,, timeout performed, Correct Patient, Correct Site, Correct Laterality, Correct Procedure, Correct Position, site marked, Risks and benefits discussed,  Surgical consent,  Pre-op evaluation,  At surgeon's request and post-op pain management  Laterality: Right  Prep: chloraprep       Needles:  Injection technique: Single-shot  Needle Type: Echogenic Needle     Needle Length: 9cm  Needle Gauge: 21     Additional Needles:   Procedures:,,,, ultrasound used (permanent image in chart),,,,  Narrative:  Start time: 06/16/2019 8:24 AM End time: 06/16/2019 8:28 AM Injection made incrementally with aspirations every 5 mL.  Performed by: Personally  Anesthesiologist: Suzette Battiest, MD

## 2019-06-17 ENCOUNTER — Encounter (HOSPITAL_COMMUNITY): Payer: Self-pay | Admitting: Orthopedic Surgery

## 2019-06-17 DIAGNOSIS — M1711 Unilateral primary osteoarthritis, right knee: Secondary | ICD-10-CM | POA: Diagnosis not present

## 2019-06-17 LAB — CBC
HCT: 37.5 % (ref 36.0–46.0)
Hemoglobin: 12.2 g/dL (ref 12.0–15.0)
MCH: 29.8 pg (ref 26.0–34.0)
MCHC: 32.5 g/dL (ref 30.0–36.0)
MCV: 91.7 fL (ref 80.0–100.0)
Platelets: 175 10*3/uL (ref 150–400)
RBC: 4.09 MIL/uL (ref 3.87–5.11)
RDW: 12.5 % (ref 11.5–15.5)
WBC: 12.6 10*3/uL — ABNORMAL HIGH (ref 4.0–10.5)
nRBC: 0 % (ref 0.0–0.2)

## 2019-06-17 LAB — BASIC METABOLIC PANEL
Anion gap: 7 (ref 5–15)
BUN: 27 mg/dL — ABNORMAL HIGH (ref 8–23)
CO2: 21 mmol/L — ABNORMAL LOW (ref 22–32)
Calcium: 8.5 mg/dL — ABNORMAL LOW (ref 8.9–10.3)
Chloride: 105 mmol/L (ref 98–111)
Creatinine, Ser: 1.16 mg/dL — ABNORMAL HIGH (ref 0.44–1.00)
GFR calc Af Amer: 54 mL/min — ABNORMAL LOW (ref >60)
GFR calc non Af Amer: 47 mL/min — ABNORMAL LOW (ref >60)
Glucose, Bld: 250 mg/dL — ABNORMAL HIGH (ref 70–99)
Potassium: 4.4 mmol/L (ref 3.5–5.1)
Sodium: 133 mmol/L — ABNORMAL LOW (ref 135–145)

## 2019-06-17 LAB — GLUCOSE, CAPILLARY
Glucose-Capillary: 196 mg/dL — ABNORMAL HIGH (ref 70–99)
Glucose-Capillary: 169 mg/dL — ABNORMAL HIGH (ref 70–99)

## 2019-06-17 MED ORDER — TIZANIDINE HCL 2 MG PO TABS: 2.0000 mg | ORAL_TABLET | Freq: Three times a day (TID) | ORAL | 0 refills | Status: DC | PRN

## 2019-06-17 MED ORDER — ASPIRIN 325 MG PO TBEC: 325.0000 mg | DELAYED_RELEASE_TABLET | Freq: Two times a day (BID) | ORAL | 0 refills | Status: DC

## 2019-06-17 MED ORDER — OXYCODONE HCL 5 MG PO TABS: 5.0000 mg | ORAL_TABLET | ORAL | 0 refills | Status: DC | PRN

## 2019-06-17 NOTE — Progress Notes (Signed)
Therapy Plan: Prearranged [facility] in orthopedic office. Has DME

## 2019-06-17 NOTE — Anesthesia Postprocedure Evaluation (Signed)
Anesthesia Post Note  Patient: Kayla Horne  Procedure(s) Performed: TOTAL KNEE ARTHROPLASTY (Right Knee)     Patient location during evaluation: PACU Anesthesia Type: Spinal Level of consciousness: awake and alert Pain management: pain level controlled Vital Signs Assessment: post-procedure vital signs reviewed and stable Respiratory status: spontaneous breathing and respiratory function stable Cardiovascular status: blood pressure returned to baseline and stable Postop Assessment: spinal receding Anesthetic complications: no    Last Vitals:  Vitals:   06/17/19 0411 06/17/19 0846  BP: (!) 120/59 (!) 139/54  Pulse: 63 64  Resp: 14   Temp: 36.6 C 36.8 C  SpO2: 97% 97%    Last Pain:  Vitals:   06/17/19 0930  TempSrc:   PainSc: 1                  Tiajuana Amass

## 2019-06-17 NOTE — Progress Notes (Signed)
RN reviewed discharge instructions with patient and family. All questions answered.   Paperwork and prescriptions given.   NT rolled patient down with all belongings to family car. 

## 2019-06-17 NOTE — Progress Notes (Signed)
Physical Therapy Treatment Patient Details Name: Kayla Horne MRN: [id number] DOB: April 29, 1946 Today's Date: 06/17/2019    History of Present Illness Patient is 73 y.o. female s/p Rt TKA on 06/16/19 with PMH significant for HTN, DM, and Parkinsons.    PT Comments    POD # 1 am session Pt OOB in recliner.  Assisted with amb and one step.  Then returned to room to perform some TE's following HEP handout.  Instructed on proper tech, freq as well as use of ICE.   Addressed all mobility questions, discussed appropriate activity, educated on use of ICE.  Pt ready for D/C to home.   Follow Up Recommendations  Follow surgeon's recommendation for DC plan and follow-up therapies     Equipment Recommendations  None recommended by PT    Recommendations for Other Services       Precautions / Restrictions Precautions Precautions: Fall;Knee Restrictions Weight Bearing Restrictions: No Other Position/Activity Restrictions: WBAT    Mobility  Bed Mobility               General bed mobility comments: OOB in recliner  Transfers Overall transfer level: Needs assistance Equipment used: Rolling walker (2 wheeled) Transfers: Sit to/from Stand Sit to Stand: Min guard;Supervision         General transfer comment: <25% VC's on safety with turns and hand placement with stand to sit  Ambulation/Gait Ambulation/Gait assistance: Supervision;Min guard Gait Distance (Feet): 45 Feet Assistive device: Rolling walker (2 wheeled) Gait Pattern/deviations: Step-through pattern;Decreased stance time - right;Decreased step length - left;Decreased stride length Gait velocity: decreased   General Gait Details: pt walked earl;ier a great distance this morning.  <25% VC's on proper walker to self distance and upright posture.   Stairs Stairs: Yes     Number of Stairs: 1 General stair comments: 25% VC's on proper walker placement and sequencing   Wheelchair Mobility    Modified  Rankin (Stroke Patients Only)       Balance                                            Cognition Arousal/Alertness: Awake/alert Behavior During Therapy: WFL for tasks assessed/performed Overall Cognitive Status: Within Functional Limits for tasks assessed                                        Exercises   Total Knee Replacement TE's 10 reps B LE ankle pumps 10 reps towel squeezes 10 reps knee presses 10 reps heel slides  10 reps SAQ's 10 reps SLR's 10 reps ABD Followed by ICE     General Comments        Pertinent Vitals/Pain Pain Assessment: 0-10 Pain Score: 3  Pain Location: Rt knee Pain Descriptors / Indicators: Aching;Sore;Operative site guarding Pain Intervention(s): Monitored during session;Premedicated before session;Repositioned;Ice applied    Home Living                      Prior Function            PT Goals (current goals can now be found in the care plan section)      Frequency    7X/week      PT Plan Current plan remains appropriate    Co-evaluation  AM-PAC PT "6 Clicks" Mobility   Outcome Measure  Help needed turning from your back to your side while in a flat bed without using bedrails?: None Help needed moving from lying on your back to sitting on the side of a flat bed without using bedrails?: None Help needed moving to and from a bed to a chair (including a wheelchair)?: None Help needed standing up from a chair using your arms (e.g., wheelchair or bedside chair)?: A Little Help needed to walk in hospital room?: A Little Help needed climbing 3-5 steps with a railing? : A Little 6 Click Score: 21    End of Session Equipment Utilized During Treatment: Gait belt Activity Tolerance: Patient tolerated treatment well Patient left: in chair;with call bell/phone within reach;with chair alarm set Nurse Communication: Mobility status(pt ready for D/XC to home with one PT  session) PT Visit Diagnosis: Muscle weakness (generalized) (M62.81);Difficulty in walking, not elsewhere classified (R26.2)     Time: 2952-8413 PT Time Calculation (min) (ACUTE ONLY): 25 min  Charges:  $Gait Training: 8-22 mins $Therapeutic Exercise: 8-22 mins                     Rica Koyanagi  PTA Acute  Rehabilitation Services Pager      [phone] Office      [phone]

## 2019-06-17 NOTE — Progress Notes (Signed)
SPORTS MEDICINE AND JOINT REPLACEMENT  Kayla Spurling, MD    Laurier Nancy, PA-C [address] [place], [place], [place]  [address]                             [phone]   PROGRESS NOTE  Subjective:  negative for Chest Pain  negative for Shortness of Breath  negative for Nausea/Vomiting   negative for Calf Pain  negative for Bowel Movement   Tolerating Diet: yes         Patient reports pain as 4 on 0-10 scale.    Objective: Vital signs in last 24 hours:    Patient Vitals for the past 24 hrs:  BP Temp Temp src Pulse Resp SpO2 Height Weight  06/17/19 0411 (!) 120/59 97.8 F (36.6 C) Oral 63 14 97 % - -  06/17/19 0034 114/65 97.7 F (36.5 C) Oral 69 14 93 % - -  06/16/19 2126 126/69 97.6 F (36.4 C) Oral 82 - 95 % - -  06/16/19 1509 128/60 97.8 F (36.6 C) Oral 86 16 91 % - -  06/16/19 1407 136/76 97.6 F (36.4 C) Oral 77 16 96 % - -  06/16/19 1308 139/66 97.6 F (36.4 C) Oral 72 16 100 % - -  06/16/19 1212 - - - - - - 5\' 1"  (1.549 m) 86 kg  06/16/19 1155 117/60 97.6 F (36.4 C) - 70 16 100 % - -  06/16/19 1145 131/84 (!) 97.4 F (36.3 C) - 68 17 98 % - -  06/16/19 1130 (!) 137/54 - - 65 17 99 % - -  06/16/19 1115 (!) 133/47 - - 72 14 96 % - -  06/16/19 1100 112/62 - - 62 19 100 % - -  06/16/19 1052 (!) 122/51 98.2 F (36.8 C) - 71 19 100 % - -  06/16/19 0900 (!) 116/54 - - (!) 59 16 96 % - -  06/16/19 0850 - - - 64 16 96 % - -  06/16/19 0845 (!) 105/47 - - (!) 48 15 96 % - -  06/16/19 0840 (!) 117/48 - - (!) 59 18 95 % - -  06/16/19 0835 (!) 105/50 - - (!) 50 16 94 % - -  06/16/19 0830 124/62 - - (!) 59 15 100 % - -  06/16/19 0825 117/76 - - (!) 52 15 95 % - -    @flow {1959:LAST@   Intake/Output from previous day:   11/16 0701 - 11/17 0700 In: 3445.4 [P.O.:840; I.V.:2305.4] Out: 1195 [Urine:1175]   Intake/Output this shift:   No intake/output data recorded.   Intake/Output      11/16 0701 - 11/17 0700 11/17 0701 - 11/18 0700   P.O. 840    I.V. (mL/kg)  2305.4 (26.8)    Other 0    IV Piggyback 300    Total Intake(mL/kg) 3445.4 (40.1)    Urine (mL/kg/hr) 1175 (0.6)    Stool 0    Blood 20    Total Output 1195    Net +2250.4         Stool Occurrence 0 x       LABORATORY DATA: Recent Labs    06/13/19 1120 06/17/19 0325  WBC 8.2 12.6*  HGB 14.1 12.2  HCT 43.6 37.5  PLT 208 175   Recent Labs    06/13/19 1120 06/17/19 0325  NA 140 133*  K 4.2 4.4  CL 107 105  CO2 25 21*  BUN 22 27*  CREATININE 0.94 1.16*  GLUCOSE 67* 250*  CALCIUM 9.5 8.5*   Lab Results  Component Value Date   INR 1.0 02/04/2007    Examination:  General appearance: alert, cooperative and no distress Extremities: extremities normal, atraumatic, no cyanosis or edema  Wound Exam: clean, dry, intact   Drainage:  None: wound tissue dry  Motor Exam: Quadriceps and Hamstrings Intact  Sensory Exam: Superficial Peroneal, Deep Peroneal and Tibial normal   Assessment:    1 Day Post-Op  Procedure(s) (LRB): TOTAL KNEE ARTHROPLASTY (Right)  ADDITIONAL DIAGNOSIS:  Active Problems:   S/P total knee replacement     Plan: Physical Therapy as ordered Weight Bearing as Tolerated (WBAT)  DVT Prophylaxis:  Aspirin  DISCHARGE PLAN: Home     patient doing well, if she passes PT today she is ready for D/C    Patient's anticipated LOS is less than 2 midnights, meeting these requirements: - Lives within 1 hour of care - Has a competent adult at home to recover with post-op recover - NO history of  - Chronic pain requiring opiods  - Diabetes  - Coronary Artery Disease  - Heart failure  - Heart attack  - Stroke  - DVT/VTE  - Cardiac arrhythmia  - Respiratory Failure/COPD  - Renal failure  - Anemia  - Advanced Liver disease        [name] 06/17/2019, 7:36 AM

## 2019-06-17 NOTE — Discharge Summary (Signed)
SPORTS MEDICINE & JOINT REPLACEMENT   Georgena Spurling, MD   Laurier Nancy, PA-C [address] [place], [place], [place]  [address]                             [phone]  PATIENT ID: TOMEKA KANTNER        MRN:  [id number]          DOB/AGE: 1946-01-31 / 73 y.o.    DISCHARGE SUMMARY  ADMISSION DATE:    06/16/2019 DISCHARGE DATE:   06/17/2019   ADMISSION DIAGNOSIS: Primary Osteoarthritis Right Knee    DISCHARGE DIAGNOSIS:  Primary Osteoarthritis Right Knee    ADDITIONAL DIAGNOSIS: Active Problems:   S/P total knee replacement  Past Medical History:  Diagnosis Date  . Anxiety   . DEGENERATIVE DISC DISEASE 10/12/2008   Qualifier: Diagnosis of  By: Maple Hudson MD, Clinton D   . Depression   . DIABETES, TYPE 2 02/16/2008   Qualifier: Diagnosis of  By: Maple Hudson MD, Clinton D   . Esophageal reflux 02/11/2008   Centricity Description: GASTROESOPHAGEAL REFLUX DISEASE, CHRONIC Qualifier: Diagnosis of  By: Maple Hudson MD, Rennis Chris  Centricity Description: Gean Maidens D Qualifier: Diagnosis of  By: Maple Hudson MD, Clinton D   . Hemoptysis 10/12/2008   Qualifier: Diagnosis of  By: Maple Hudson MD, Clinton D   . Hypertension   . Hypothyroidism   . Parkinsonism (HCC) 09/09/2015  . Restless leg syndrome 09/09/2015  . Tremor 09/09/2015    PROCEDURE: Procedure(s): TOTAL KNEE ARTHROPLASTY on 06/16/2019  CONSULTS:    HISTORY:  See H&P in chart  HOSPITAL COURSE:  LEIANA RUND is a 73 y.o. admitted on 06/16/2019 and found to have a diagnosis of Primary Osteoarthritis Right Knee.  After appropriate laboratory studies were obtained  they were taken to the operating room on 06/16/2019 and underwent Procedure(s): TOTAL KNEE ARTHROPLASTY.   They were given perioperative antibiotics:  Anti-infectives (From admission, onward)   Start     Dose/Rate Route Frequency Ordered Stop   06/16/19 1500  ceFAZolin (ANCEF) IVPB 2g/100 mL premix     2 g 200 mL/hr over 30 Minutes Intravenous Every 6 hours 06/16/19 1157 06/16/19 2200    06/16/19 0700  ceFAZolin (ANCEF) IVPB 2g/100 mL premix     2 g 200 mL/hr over 30 Minutes Intravenous On call to O.R. 06/16/19 2130 06/16/19 0931    .  Patient given tranexamic acid IV or topical and exparel intra-operatively.  Tolerated the procedure well.    POD# 1: Vital signs were stable.  Patient denied Chest pain, shortness of breath, or calf pain.  Patient was started on Aspirin twice daily at 8am.  Consults to PT, OT, and care management were made.  The patient was weight bearing as tolerated.  CPM was placed on the operative leg 0-90 degrees for 6-8 hours a day. When out of the CPM, patient was placed in the foam block to achieve full extension. Incentive spirometry was taught.  Dressing was changed.       POD #2, Continued  PT for ambulation and exercise program.  IV saline locked.  O2 discontinued.    The remainder of the hospital course was dedicated to ambulation and strengthening.   The patient was discharged on 1 Day Post-Op in  Good condition.  Blood products given:none  DIAGNOSTIC STUDIES: Recent vital signs:  Patient Vitals for the past 24 hrs:  BP Temp Temp src Pulse Resp SpO2  Height Weight  06/17/19 0411 (!) 120/59 97.8 F (36.6 C) Oral 63 14 97 % - -  06/17/19 0034 114/65 97.7 F (36.5 C) Oral 69 14 93 % - -  06/16/19 2126 126/69 97.6 F (36.4 C) Oral 82 - 95 % - -  06/16/19 1509 128/60 97.8 F (36.6 C) Oral 86 16 91 % - -  06/16/19 1407 136/76 97.6 F (36.4 C) Oral 77 16 96 % - -  06/16/19 1308 139/66 97.6 F (36.4 C) Oral 72 16 100 % - -  06/16/19 1212 - - - - - -  (1.549 m) 86 kg  06/16/19 1155 117/60 97.6 F (36.4 C) - 70 16 100 % - -  06/16/19 1145 131/84 (!) 97.4 F (36.3 C) - 68 17 98 % - -  06/16/19 1130 (!) 137/54 - - 65 17 99 % - -  06/16/19 1115 (!) 133/47 - - 72 14 96 % - -  06/16/19 1100 112/62 - - 62 19 100 % - -  06/16/19 1052 (!) 122/51 98.2 F (36.8 C) - 71 19 100 % - -  06/16/19 0900 (!) 116/54 - - (!) 59 16 96 % - -  06/16/19  0850 - - - 64 16 96 % - -  06/16/19 0845 (!) 105/47 - - (!) 48 15 96 % - -  06/16/19 0840 (!) 117/48 - - (!) 59 18 95 % - -  06/16/19 0835 (!) 105/50 - - (!) 50 16 94 % - -  06/16/19 0830 124/62 - - (!) 59 15 100 % - -  06/16/19 0825 117/76 - - (!) 52 15 95 % - -       Recent laboratory studies: Recent Labs    06/13/19 1120 06/17/19 0325  WBC 8.2 12.6*  HGB 14.1 12.2  HCT 43.6 37.5  PLT 208 175   Recent Labs    06/13/19 1120 06/17/19 0325  NA 140 133*  K 4.2 4.4  CL 107 105  CO2 25 21*  BUN 22 27*  CREATININE 0.94 1.16*  GLUCOSE 67* 250*  CALCIUM 9.5 8.5*   Lab Results  Component Value Date   INR 1.0 02/04/2007     Recent Radiographic Studies :  No results found.  DISCHARGE INSTRUCTIONS: Discharge Instructions    Call MD / Call 911   Complete by: As directed    If you experience chest pain or shortness of breath, CALL 911 and be transported to the hospital emergency room.  If you develope a fever above 101 F, pus (white drainage) or increased drainage or redness at the wound, or calf pain, call your surgeon's office.   Constipation Prevention   Complete by: As directed    Drink plenty of fluids.  Prune juice may be helpful.  You may use a stool softener, such as Colace (over the counter) 100 mg twice a day.  Use MiraLax (over the counter) for constipation as needed.   Diet - low sodium heart healthy   Complete by: As directed    Discharge instructions   Complete by: As directed    INSTRUCTIONS AFTER JOINT REPLACEMENT   Remove items at home which could result in a fall. This includes throw rugs or furniture in walking pathways ICE to the affected joint every three hours while awake for 30 minutes at a time, for at least the first 3-5 days, and then as needed for pain and swelling.  Continue to use ice for pain  and swelling. You may notice swelling that will progress down to the foot and ankle.  This is normal after surgery.  Elevate your leg when you are not up  walking on it.   Continue to use the breathing machine you got in the hospital (incentive spirometer) which will help keep your temperature down.  It is common for your temperature to cycle up and down following surgery, especially at night when you are not up moving around and exerting yourself.  The breathing machine keeps your lungs expanded and your temperature down.   DIET:  As you were doing prior to hospitalization, we recommend a well-balanced diet.  DRESSING / WOUND CARE / SHOWERING  Keep the surgical dressing until follow up.  The dressing is water proof, so you can shower without any extra covering.  IF THE DRESSING FALLS OFF or the wound gets wet inside, change the dressing with sterile gauze.  Please use good hand washing techniques before changing the dressing.  Do not use any lotions or creams on the incision until instructed by your surgeon.    ACTIVITY  Increase activity slowly as tolerated, but follow the weight bearing instructions below.   No driving for 6 weeks or until further direction given by your physician.  You cannot drive while taking narcotics.  No lifting or carrying greater than 10 lbs. until further directed by your surgeon. Avoid periods of inactivity such as sitting longer than an hour when not asleep. This helps prevent blood clots.  You may return to work once you are authorized by your doctor.     WEIGHT BEARING   Weight bearing as tolerated with assist device (walker, cane, etc) as directed, use it as long as suggested by your surgeon or therapist, typically at least 4-6 weeks.   EXERCISES  Results after joint replacement surgery are often greatly improved when you follow the exercise, range of motion and muscle strengthening exercises prescribed by your doctor. Safety measures are also important to protect the joint from further injury. Any time any of these exercises cause you to have increased pain or swelling, decrease what you are doing until you  are comfortable again and then slowly increase them. If you have problems or questions, call your caregiver or physical therapist for advice.   Rehabilitation is important following a joint replacement. After just a few days of immobilization, the muscles of the leg can become weakened and shrink (atrophy).  These exercises are designed to build up the tone and strength of the thigh and leg muscles and to improve motion. Often times heat used for twenty to thirty minutes before working out will loosen up your tissues and help with improving the range of motion but do not use heat for the first two weeks following surgery (sometimes heat can increase post-operative swelling).   These exercises can be done on a training (exercise) mat, on the floor, on a table or on a bed. Use whatever works the best and is most comfortable for you.    Use music or television while you are exercising so that the exercises are a pleasant break in your day. This will make your life better with the exercises acting as a break in your routine that you can look forward to.   Perform all exercises about fifteen times, three times per day or as directed.  You should exercise both the operative leg and the other leg as well.   Exercises include:   BankerQuad Sets - Tighten  up the muscle on the front of the thigh (Quad) and hold for 5-10 seconds.   Straight Leg Raises - With your knee straight (if you were given a brace, keep it on), lift the leg to 60 degrees, hold for 3 seconds, and slowly lower the leg.  Perform this exercise against resistance later as your leg gets stronger.  Leg Slides: Lying on your back, slowly slide your foot toward your buttocks, bending your knee up off the floor (only go as far as is comfortable). Then slowly slide your foot back down until your leg is flat on the floor again.  Angel Wings: Lying on your back spread your legs to the side as far apart as you can without causing discomfort.  Hamstring Strength:   Lying on your back, push your heel against the floor with your leg straight by tightening up the muscles of your buttocks.  Repeat, but this time bend your knee to a comfortable angle, and push your heel against the floor.  You may put a pillow under the heel to make it more comfortable if necessary.   A rehabilitation program following joint replacement surgery can speed recovery and prevent re-injury in the future due to weakened muscles. Contact your doctor or a physical therapist for more information on knee rehabilitation.    CONSTIPATION  Constipation is defined medically as fewer than three stools per week and severe constipation as less than one stool per week.  Even if you have a regular bowel pattern at home, your normal regimen is likely to be disrupted due to multiple reasons following surgery.  Combination of anesthesia, postoperative narcotics, change in appetite and fluid intake all can affect your bowels.   YOU MUST use at least one of the following options; they are listed in order of increasing strength to get the job done.  They are all available over the counter, and you may need to use some, POSSIBLY even all of these options:    Drink plenty of fluids (prune juice may be helpful) and high fiber foods Colace 100 mg by mouth twice a day  Senokot for constipation as directed and as needed Dulcolax (bisacodyl), take with full glass of water  Miralax (polyethylene glycol) once or twice a day as needed.  If you have tried all these things and are unable to have a bowel movement in the first 3-4 days after surgery call either your surgeon or your primary doctor.    If you experience loose stools or diarrhea, hold the medications until you stool forms back up.  If your symptoms do not get better within 1 week or if they get worse, check with your doctor.  If you experience "the worst abdominal pain ever" or develop nausea or vomiting, please contact the office immediately for further  recommendations for treatment.   ITCHING:  If you experience itching with your medications, try taking only a single pain pill, or even half a pain pill at a time.  You can also use Benadryl over the counter for itching or also to help with sleep.   TED HOSE STOCKINGS:  Use stockings on both legs until for at least 2 weeks or as directed by physician office. They may be removed at night for sleeping.  MEDICATIONS:  See your medication summary on the "After Visit Summary" that nursing will review with you.  You may have some home medications which will be placed on hold until you complete the course of blood thinner medication.  It is important for you to complete the blood thinner medication as prescribed.  PRECAUTIONS:  If you experience chest pain or shortness of breath - call 911 immediately for transfer to the hospital emergency department.   If you develop a fever greater that 101 F, purulent drainage from wound, increased redness or drainage from wound, foul odor from the wound/dressing, or calf pain - CONTACT YOUR SURGEON.                                                   FOLLOW-UP APPOINTMENTS:  If you do not already have a post-op appointment, please call the office for an appointment to be seen by your surgeon.  Guidelines for how soon to be seen are listed in your "After Visit Summary", but are typically between 1-4 weeks after surgery.  OTHER INSTRUCTIONS:   Knee Replacement:  Do not place pillow under knee, focus on keeping the knee straight while resting. CPM instructions: 0-90 degrees, 2 hours in the morning, 2 hours in the afternoon, and 2 hours in the evening. Place foam block, curve side up under heel at all times except when in CPM or when walking.  DO NOT modify, tear, cut, or change the foam block in any way.  MAKE SURE YOU:  Understand these instructions.  Get help right away if you are not doing well or get worse.    Thank you for letting us be a part of your medical  care team.  It is a privilege we respect greatly.  We hope these instructions will help you stay on track for a fast and full recovery!   Increase activity slowly as tolerated   Complete by: As directed       DISCHARGE MEDICATIONS:   Allergies as of 06/17/2019      Reactions   Aspirin Other (See Comments)   dyspepsia   Codeine Sulfate Nausea Only   Morphine Nausea And Vomiting   Latex Rash   Tape Rash   "latex tape"      Medication List    STOP taking these medications   aspirin 81 MG chewable tablet Replaced by: aspirin 325 MG EC tablet     TAKE these medications   ALPRAZolam 1 MG tablet Commonly known as: XANAX Take 0.5-1 mg by mouth 3 (three) times daily as needed for anxiety or sleep (restless leg). For anxiety   amLODipine 10 MG tablet Commonly known as: NORVASC Take 5 mg by mouth 2 (two) times daily.   aspirin 325 MG EC tablet Take 1 tablet (325 mg total) by mouth 2 (two) times daily. Replaces: aspirin 81 MG chewable tablet   citalopram 20 MG tablet Commonly known as: CELEXA Take 20 mg by mouth daily.   diphenoxylate-atropine 2.5-0.025 MG tablet Commonly known as: LOMOTIL Take 1 tablet by mouth 2 (two) times daily as needed for diarrhea or loose stools.   furosemide 20 MG tablet Commonly known as: LASIX Take 40 mg by mouth daily.   insulin NPH-regular Human (70-30) 100 UNIT/ML injection Inject 35 Units into the skin 2 (two) times daily with a meal.   levothyroxine 100 MCG tablet Commonly known as: SYNTHROID Take 100 mcg by mouth daily.   losartan 100 MG tablet Commonly known as: COZAAR Take 100 mg by mouth daily.   nitroGLYCERIN 0.4 MG SL tablet Commonly known  as: NITROSTAT Place 1 tablet (0.4 mg total) under the tongue every 5 (five) minutes as needed.   ONE TOUCH ULTRA TEST test strip Generic drug: glucose blood USE 1 STRIP TO CHECK GLUCOSE THREE TIMES DAILY   oxybutynin 5 MG tablet Commonly known as: DITROPAN Take 5 mg by mouth 2 (two)  times daily.   oxyCODONE 5 MG immediate release tablet Commonly known as: Oxy IR/ROXICODONE Take 1 tablet (5 mg total) by mouth every 4 (four) hours as needed for moderate pain (pain score 4-6).   pantoprazole 40 MG tablet Commonly known as: PROTONIX Take 40 mg by mouth daily.   pramipexole 0.5 MG tablet Commonly known as: MIRAPEX Take 0.5 mg by mouth 3 (three) times daily.   pravastatin 20 MG tablet Commonly known as: PRAVACHOL Take 20 mg by mouth daily.   tiZANidine 2 MG tablet Commonly known as: ZANAFLEX Take 1 tablet (2 mg total) by mouth every 8 (eight) hours as needed.            Durable Medical Equipment  (From admission, onward)         Start     Ordered   06/16/19 1158  DME Walker rolling  Once    Question:  Patient needs a walker to treat with the following condition  Answer:  S/P total knee replacement   06/16/19 1157   06/16/19 1158  DME 3 n 1  Once     06/16/19 1157   06/16/19 1158  DME Bedside commode  Once    Question:  Patient needs a bedside commode to treat with the following condition  Answer:  S/P total knee replacement   06/16/19 1157          FOLLOW UP VISIT:    DISPOSITION: HOME VS. SNF  CONDITION:  Good   Guy Sandifer 06/17/2019, 7:40 AM

## 2019-09-10 DIAGNOSIS — R5383 Other fatigue: Secondary | ICD-10-CM | POA: Insufficient documentation

## 2019-09-10 DIAGNOSIS — R5381 Other malaise: Secondary | ICD-10-CM

## 2019-09-10 DIAGNOSIS — M1712 Unilateral primary osteoarthritis, left knee: Secondary | ICD-10-CM

## 2019-09-10 HISTORY — DX: Other malaise: R53.81

## 2019-09-10 HISTORY — DX: Other fatigue: R53.83

## 2019-09-10 HISTORY — DX: Unilateral primary osteoarthritis, left knee: M17.12

## 2019-11-20 DIAGNOSIS — Z9889 Other specified postprocedural states: Secondary | ICD-10-CM | POA: Insufficient documentation

## 2019-11-20 DIAGNOSIS — Z8719 Personal history of other diseases of the digestive system: Secondary | ICD-10-CM

## 2019-11-20 HISTORY — DX: Other specified postprocedural states: Z98.890

## 2019-11-20 HISTORY — DX: Personal history of other diseases of the digestive system: Z87.19

## 2019-12-01 DIAGNOSIS — R6889 Other general symptoms and signs: Secondary | ICD-10-CM

## 2019-12-01 HISTORY — DX: Other general symptoms and signs: R68.89

## 2019-12-05 DIAGNOSIS — M793 Panniculitis, unspecified: Secondary | ICD-10-CM | POA: Insufficient documentation

## 2019-12-05 HISTORY — DX: Panniculitis, unspecified: M79.3

## 2020-04-24 ENCOUNTER — Other Ambulatory Visit: Payer: Self-pay

## 2020-04-24 ENCOUNTER — Emergency Department (HOSPITAL_COMMUNITY): Payer: Medicare Other

## 2020-04-24 ENCOUNTER — Emergency Department (HOSPITAL_COMMUNITY)
Admission: EM | Admit: 2020-04-24 | Discharge: 2020-04-24 | Disposition: A | Discharge status: Home / Self Care | Payer: Medicare Other | Attending: Emergency Medicine | Admitting: Emergency Medicine

## 2020-04-24 ENCOUNTER — Encounter (HOSPITAL_COMMUNITY): Payer: Self-pay

## 2020-04-24 DIAGNOSIS — Z20822 Contact with and (suspected) exposure to covid-19: Secondary | ICD-10-CM | POA: Insufficient documentation

## 2020-04-24 DIAGNOSIS — F17210 Nicotine dependence, cigarettes, uncomplicated: Secondary | ICD-10-CM | POA: Insufficient documentation

## 2020-04-24 DIAGNOSIS — Z794 Long term (current) use of insulin: Secondary | ICD-10-CM | POA: Insufficient documentation

## 2020-04-24 DIAGNOSIS — Z7982 Long term (current) use of aspirin: Secondary | ICD-10-CM | POA: Insufficient documentation

## 2020-04-24 DIAGNOSIS — Z96651 Presence of right artificial knee joint: Secondary | ICD-10-CM | POA: Diagnosis not present

## 2020-04-24 DIAGNOSIS — E119 Type 2 diabetes mellitus without complications: Secondary | ICD-10-CM | POA: Insufficient documentation

## 2020-04-24 DIAGNOSIS — Z91040 Latex allergy status: Secondary | ICD-10-CM | POA: Diagnosis not present

## 2020-04-24 DIAGNOSIS — I129 Hypertensive chronic kidney disease with stage 1 through stage 4 chronic kidney disease, or unspecified chronic kidney disease: Secondary | ICD-10-CM | POA: Diagnosis not present

## 2020-04-24 DIAGNOSIS — N183 Chronic kidney disease, stage 3 unspecified: Secondary | ICD-10-CM | POA: Diagnosis not present

## 2020-04-24 DIAGNOSIS — Z79899 Other long term (current) drug therapy: Secondary | ICD-10-CM | POA: Insufficient documentation

## 2020-04-24 DIAGNOSIS — T887XXA Unspecified adverse effect of drug or medicament, initial encounter: Secondary | ICD-10-CM

## 2020-04-24 DIAGNOSIS — R531 Weakness: Secondary | ICD-10-CM | POA: Insufficient documentation

## 2020-04-24 DIAGNOSIS — Z79890 Hormone replacement therapy (postmenopausal): Secondary | ICD-10-CM | POA: Insufficient documentation

## 2020-04-24 DIAGNOSIS — E039 Hypothyroidism, unspecified: Secondary | ICD-10-CM | POA: Diagnosis not present

## 2020-04-24 DIAGNOSIS — R4182 Altered mental status, unspecified: Secondary | ICD-10-CM

## 2020-04-24 LAB — BLOOD GAS, ARTERIAL
Acid-base deficit: 1.3 mmol/L (ref 0.0–2.0)
Bicarbonate: 23.6 mmol/L (ref 20.0–28.0)
Drawn by: 331471
FIO2: 21
O2 Saturation: 93.2 %
Patient temperature: 98.6
pCO2 arterial: 42.9 mmHg (ref 32.0–48.0)
pH, Arterial: 7.36 (ref 7.350–7.450)
pO2, Arterial: 69.9 mmHg — ABNORMAL LOW (ref 83.0–108.0)

## 2020-04-24 LAB — CBC
HCT: 41.4 % (ref 36.0–46.0)
Hemoglobin: 14.2 g/dL (ref 12.0–15.0)
MCH: 31.1 pg (ref 26.0–34.0)
MCHC: 34.3 g/dL (ref 30.0–36.0)
MCV: 90.8 fL (ref 80.0–100.0)
Platelets: 173 10*3/uL (ref 150–400)
RBC: 4.56 MIL/uL (ref 3.87–5.11)
RDW: 13.2 % (ref 11.5–15.5)
WBC: 6.9 10*3/uL (ref 4.0–10.5)
nRBC: 0 % (ref 0.0–0.2)

## 2020-04-24 LAB — COMPREHENSIVE METABOLIC PANEL
ALT: 51 U/L — ABNORMAL HIGH (ref 0–44)
AST: 42 U/L — ABNORMAL HIGH (ref 15–41)
Albumin: 3.9 g/dL (ref 3.5–5.0)
Alkaline Phosphatase: 133 U/L — ABNORMAL HIGH (ref 38–126)
Anion gap: 8 (ref 5–15)
BUN: 23 mg/dL (ref 8–23)
CO2: 24 mmol/L (ref 22–32)
Calcium: 9.5 mg/dL (ref 8.9–10.3)
Chloride: 106 mmol/L (ref 98–111)
Creatinine, Ser: 1.01 mg/dL — ABNORMAL HIGH (ref 0.44–1.00)
GFR calc Af Amer: >60 mL/min (ref >60)
GFR calc non Af Amer: 55 mL/min — ABNORMAL LOW (ref >60)
Glucose, Bld: 222 mg/dL — ABNORMAL HIGH (ref 70–99)
Potassium: 4 mmol/L (ref 3.5–5.1)
Sodium: 138 mmol/L (ref 135–145)
Total Bilirubin: 0.6 mg/dL (ref 0.3–1.2)
Total Protein: 7.5 g/dL (ref 6.5–8.1)

## 2020-04-24 LAB — LACTIC ACID, PLASMA: Lactic Acid, Venous: 1.1 mmol/L (ref 0.5–1.9)

## 2020-04-24 LAB — URINALYSIS, ROUTINE W REFLEX MICROSCOPIC
Bilirubin Urine: NEGATIVE
Glucose, UA: 250 mg/dL — AB
Hgb urine dipstick: NEGATIVE
Ketones, ur: NEGATIVE mg/dL
Leukocytes,Ua: NEGATIVE
Nitrite: NEGATIVE
Protein, ur: NEGATIVE mg/dL
Specific Gravity, Urine: 1.025 (ref 1.005–1.030)
pH: 5.5 (ref 5.0–8.0)

## 2020-04-24 LAB — CBG MONITORING, ED: Glucose-Capillary: 135 mg/dL — ABNORMAL HIGH (ref 70–99)

## 2020-04-24 LAB — RAPID URINE DRUG SCREEN, HOSP PERFORMED
Amphetamines: NOT DETECTED
Barbiturates: NOT DETECTED
Benzodiazepines: POSITIVE — AB
Cocaine: NOT DETECTED
Opiates: NOT DETECTED
Tetrahydrocannabinol: NOT DETECTED

## 2020-04-24 LAB — ETHANOL: Alcohol, Ethyl (B): <10 mg/dL (ref <10)

## 2020-04-24 LAB — RESPIRATORY PANEL BY RT PCR (FLU A&B, COVID)
Influenza A by PCR: NEGATIVE
Influenza B by PCR: NEGATIVE
SARS Coronavirus 2 by RT PCR: NEGATIVE

## 2020-04-24 MED ORDER — SODIUM CHLORIDE 0.9 % IV BOLUS
1000.0000 mL | Freq: Once | INTRAVENOUS | Status: AC
  Administered 2020-04-24: 1000 mL via INTRAVENOUS

## 2020-04-24 MED ORDER — NALOXONE HCL 0.4 MG/ML IJ SOLN
0.4000 mg | Freq: Once | INTRAMUSCULAR | Status: AC
  Administered 2020-04-24: 0.4 mg via INTRAVENOUS
  Filled 2020-04-24: qty 1

## 2020-04-24 NOTE — ED Provider Notes (Signed)
[facility]-EMERGENCY DEPT Provider Note   CSN: [id number] Arrival date & time: 04/24/20  1655     History Chief Complaint  Patient presents with  . Weakness    Kayla Horne is a 74 y.o. female.  Patient with hx dm, htn, presents via EMS. EMS notes was initially a call for lifting, with other calls today for same. On arrival, pt was on ground, in front of chair, conscious but altered/drowsy. They indicate cbg in 200s.  No report of trauma, pain or injury. EMS notes no other pertinent hx obtained rom family. No report of substance use/abuse, or overuse of meds. No report of fever. No report of vomiting. Patient not verbally responsive to questions - level 5 caveat.   The history is provided by the patient, the EMS personnel and medical records. The history is limited by the condition of the patient.  Weakness      Past Medical History:  Diagnosis Date  . Anxiety   . DEGENERATIVE DISC DISEASE 10/12/2008   Qualifier: Diagnosis of  By: Maple Hudson MD, Clinton D   . Depression   . DIABETES, TYPE 2 02/16/2008   Qualifier: Diagnosis of  By: Maple Hudson MD, Clinton D   . Esophageal reflux 02/11/2008   Centricity Description: GASTROESOPHAGEAL REFLUX DISEASE, CHRONIC Qualifier: Diagnosis of  By: Maple Hudson MD, Rennis Chris  Centricity Description: Gean Maidens D Qualifier: Diagnosis of  By: Maple Hudson MD, Clinton D   . Hemoptysis 10/12/2008   Qualifier: Diagnosis of  By: Maple Hudson MD, Clinton D   . Hypertension   . Hypothyroidism   . Parkinsonism (HCC) 09/09/2015  . Restless leg syndrome 09/09/2015  . Tremor 09/09/2015    Patient Active Problem List   Diagnosis Date Noted  . S/P total knee replacement 06/16/2019  . Diabetes mellitus due to underlying condition with unspecified complications (HCC) 12/25/2018  . Bradycardia 11/14/2018  . Dysphagia 11/14/2018  . Primary osteoarthritis of right shoulder 09/05/2018  . CKD stage 3 secondary to diabetes (HCC) 06/27/2018  . Chronic bronchitis (HCC)  05/03/2018  . Pain in left knee 04/11/2018  . Localized edema 03/18/2018  . Contracture of both Achilles tendons 10/29/2017  . Metatarsalgia of both feet 10/29/2017  . 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 Vitamin B12 deficiency 03/01/2017  . Vitamin D deficiency 03/01/2017  . Callus of foot 11/13/2016  . Onychomycosis due to dermatophyte 11/13/2016  . Laceration of scalp 09/06/2016  . Neck mass 09/06/2016  . 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 Abnormal gait 09/16/2015  . Acquired hypothyroidism 09/16/2015  . Anxiety disorder 09/16/2015  . Arthritis 09/16/2015  . Essential hypertension 09/16/2015  . Essential tremor 09/16/2015  . Neuralgia 09/16/2015  . Hallux valgus 09/16/2015  . High risk medication use 09/16/2015  . Hyperlipidemia 09/16/2015  . Myalgia 09/16/2015  . Obesity 09/16/2015  . Polyneuropathy 09/16/2015  . Recurrent major depressive disorder (HCC) 09/16/2015  . Tremor 09/09/2015  . Parkinsonism (HCC) 09/09/2015  . Restless leg syndrome 09/09/2015  . DEGENERATIVE DISC DISEASE 10/12/2008  . HEMOPTYSIS 10/12/2008  . DIABETES, TYPE 2 02/16/2008  . Esophageal reflux 02/11/2008  . COUGH 02/11/2008    Past Surgical History:  Procedure Laterality Date  . ABDOMINAL HYSTERECTOMY    . BREAST REDUCTION SURGERY    . CERVICAL SPINE SURGERY    . CHOLECYSTECTOMY    . KNEE SURGERY     Arthroscopic right  . LUMBAR DISC SURGERY  2017   x 2  .  TOTAL KNEE ARTHROPLASTY Right 06/16/2019   Procedure: TOTAL KNEE ARTHROPLASTY;  Surgeon: Dannielle Huh, MD;  Location: [facility] ORS;  Service: Orthopedics;  Laterality: Right;  75 mins needed for length of case     OB History   No obstetric history on file.     Family History  Problem Relation Age of Onset  . Stroke Father 63  . CAD Father   . CAD Brother 8       CABG  . CAD Brother 36       CABG  . Restless legs syndrome Mother   . Tremor Neg Hx     Social History    Tobacco Use  . Smoking status: Current Some Day Smoker    Packs/day: 0.25    Types: Cigarettes  . Smokeless tobacco: Never Used  . Tobacco comment: 5 -7 cigarettes a week  Substance Use Topics  . Alcohol use: No  . Drug use: No    Home Medications Prior to Admission medications   Medication Sig Start Date End Date Taking? Authorizing Provider  ALPRAZolam Prudy Feeler) 1 MG tablet Take 0.5-1 mg by mouth 3 (three) times daily as needed for anxiety or sleep (restless leg). For anxiety     [provider]  amLODipine (NORVASC) 10 MG tablet Take 5 mg by mouth 2 (two) times daily.     [provider]  aspirin EC 325 MG EC tablet Take 1 tablet (325 mg total) by mouth 2 (two) times daily. 06/17/19   Guy Sandifer, PA  citalopram (CELEXA) 20 MG tablet Take 20 mg by mouth daily.    [provider]  diphenoxylate-atropine (LOMOTIL) 2.5-0.025 MG tablet Take 1 tablet by mouth 2 (two) times daily as needed for diarrhea or loose stools.     [provider]  furosemide (LASIX) 20 MG tablet Take 40 mg by mouth daily. 04/09/19   [provider]  insulin NPH-regular Human (NOVOLIN 70/30) (70-30) 100 UNIT/ML injection Inject 35 Units into the skin 2 (two) times daily with a meal.     [provider]  levothyroxine (SYNTHROID) 100 MCG tablet Take 100 mcg by mouth daily.     [provider]  losartan (COZAAR) 100 MG tablet Take 100 mg by mouth daily. 07/09/18   [provider]  nitroGLYCERIN (NITROSTAT) 0.4 MG SL tablet Place 1 tablet (0.4 mg total) under the tongue every 5 (five) minutes as needed. 04/18/19   Revankar, Aundra Dubin, MD  ONE TOUCH ULTRA TEST test strip USE 1 STRIP TO CHECK GLUCOSE THREE TIMES DAILY 08/31/18   [provider]  oxybutynin (DITROPAN) 5 MG tablet Take 5 mg by mouth 2 (two) times daily. 09/04/18   [provider]  oxyCODONE (OXY IR/ROXICODONE) 5 MG immediate release tablet Take 1 tablet (5 mg total) by  mouth every 4 (four) hours as needed for moderate pain (pain score 4-6). 06/17/19   Guy Sandifer, PA  pantoprazole (PROTONIX) 40 MG tablet Take 40 mg by mouth daily.    [provider]  pramipexole (MIRAPEX) 0.5 MG tablet Take 0.5 mg by mouth 3 (three) times daily.  09/23/15   [provider]  pravastatin (PRAVACHOL) 20 MG tablet Take 20 mg by mouth daily. 05/13/19   [provider]  tiZANidine (ZANAFLEX) 2 MG tablet Take 1 tablet (2 mg total) by mouth every 8 (eight) hours as needed. 06/17/19   Guy Sandifer, PA    Allergies    Aspirin, Codeine sulfate, Morphine,  Latex, and Tape  Review of Systems   Review of Systems  Unable to perform ROS: Mental status change  Neurological: Positive for weakness.  pt not verbally responsive - level 5 caveat  Physical Exam Updated Vital Signs BP 140/61 (BP Location: Right Arm)   Pulse (!) 55   Temp 98.4 F (36.9 C) (Oral)   Resp 20   SpO2 98%   Physical Exam Vitals and nursing note reviewed.  Constitutional:      Appearance: Normal appearance. She is well-developed.  HENT:     Head: Atraumatic.     Nose: Nose normal.     Mouth/Throat:     Mouth: Mucous membranes are moist.  Eyes:     General: No scleral icterus.    Conjunctiva/sclera: Conjunctivae normal.     Pupils: Pupils are equal, round, and reactive to light.     Comments: Pupils 3-4 mm, reactive.   Neck:     Vascular: No carotid bruit.     Trachea: No tracheal deviation.     Comments: No stiffness or rigidity.  Cardiovascular:     Rate and Rhythm: Normal rate and regular rhythm.     Pulses: Normal pulses.     Heart sounds: Normal heart sounds. No murmur heard.  No friction rub. No gallop.   Pulmonary:     Effort: Pulmonary effort is normal. No respiratory distress.     Breath sounds: Normal breath sounds.  Abdominal:     General: Bowel sounds are normal. There is no distension.     Palpations: Abdomen is soft.     Tenderness: There  is no abdominal tenderness. There is no guarding.  Genitourinary:    Comments: No cva tenderness.  Musculoskeletal:        General: No swelling or tenderness.     Cervical back: Normal range of motion and neck supple. No rigidity. No muscular tenderness.  Skin:    General: Skin is warm and dry.     Findings: No rash.  Neurological:     Mental Status: She is alert.     Comments: Eyes closed. Opens eyes and speaks unintelligible words/phrases with stimuli, rubbing sternum. Moves extremities purposefully, but does not follow commands.   Psychiatric:     Comments: Decreased responsiveness.      ED Results / Procedures / Treatments   Labs (all labs ordered are listed, but only abnormal results are displayed) Results for orders placed or performed during the hospital encounter of 04/24/20  Respiratory Panel by RT PCR (Flu A&B, Covid) - Nasopharyngeal Swab   Specimen: Nasopharyngeal Swab  Result Value Ref Range   SARS Coronavirus 2 by RT PCR NEGATIVE NEGATIVE   Influenza A by PCR NEGATIVE NEGATIVE   Influenza B by PCR NEGATIVE NEGATIVE  CBC  Result Value Ref Range   WBC 6.9 4.0 - 10.5 K/uL   RBC 4.56 3.87 - 5.11 MIL/uL   Hemoglobin 14.2 12.0 - 15.0 g/dL   HCT 94.8 36 - 46 %   MCV 90.8 80.0 - 100.0 fL   MCH 31.1 26.0 - 34.0 pg   MCHC 34.3 30.0 - 36.0 g/dL   RDW 54.6 27.0 - 35.0 %   Platelets 173 150 - 400 K/uL   nRBC 0.0 0.0 - 0.2 %  Comprehensive metabolic panel  Result Value Ref Range   Sodium 138 135 - 145 mmol/L   Potassium 4.0 3.5 - 5.1 mmol/L   Chloride 106 98 - 111 mmol/L   CO2 24 22 -  32 mmol/L   Glucose, Bld 222 (H) 70 - 99 mg/dL   BUN 23 8 - 23 mg/dL   Creatinine, Ser 1.611.01 (H) 0.44 - 1.00 mg/dL   Calcium 9.5 8.9 - 09.610.3 mg/dL   Total Protein 7.5 6.5 - 8.1 g/dL   Albumin 3.9 3.5 - 5.0 g/dL   AST 42 (H) 15 - 41 U/L   ALT 51 (H) 0 - 44 U/L   Alkaline Phosphatase 133 (H) 38 - 126 U/L   Total Bilirubin 0.6 0.3 - 1.2 mg/dL   GFR calc non Af Amer 55 (L) >60 mL/min    GFR calc Af Amer >60 >60 mL/min   Anion gap 8 5 - 15  Ethanol  Result Value Ref Range   Alcohol, Ethyl (B) <10 <10 mg/dL  Urinalysis, Routine w reflex microscopic  Result Value Ref Range   Color, Urine YELLOW YELLOW   APPearance CLOUDY (A) CLEAR   Specific Gravity, Urine 1.025 1.005 - 1.030   pH 5.5 5.0 - 8.0   Glucose, UA 250 (A) NEGATIVE mg/dL   Hgb urine dipstick NEGATIVE NEGATIVE   Bilirubin Urine NEGATIVE NEGATIVE   Ketones, ur NEGATIVE NEGATIVE mg/dL   Protein, ur NEGATIVE NEGATIVE mg/dL   Nitrite NEGATIVE NEGATIVE   Leukocytes,Ua NEGATIVE NEGATIVE  Rapid urine drug screen (hospital performed)  Result Value Ref Range   Opiates NONE DETECTED NONE DETECTED   Cocaine NONE DETECTED NONE DETECTED   Benzodiazepines POSITIVE (A) NONE DETECTED   Amphetamines NONE DETECTED NONE DETECTED   Tetrahydrocannabinol NONE DETECTED NONE DETECTED   Barbiturates NONE DETECTED NONE DETECTED  Lactic acid, plasma  Result Value Ref Range   Lactic Acid, Venous 1.1 0.5 - 1.9 mmol/L  Blood gas, arterial  Result Value Ref Range   FIO2 21.00    pH, Arterial 7.360 7.35 - 7.45   pCO2 arterial 42.9 32 - 48 mmHg   pO2, Arterial 69.9 (L) 83 - 108 mmHg   Bicarbonate 23.6 20.0 - 28.0 mmol/L   Acid-base deficit 1.3 0.0 - 2.0 mmol/L   O2 Saturation 93.2 %   Patient temperature 98.6    Collection site RIGHT BRACHIAL    Drawn by [id number]    Allens test (pass/fail) PASS PASS  CBG monitoring, ED  Result Value Ref Range   Glucose-Capillary 135 (H) 70 - 99 mg/dL   CT HEAD WO CONTRAST  Result Date: 04/24/2020 CLINICAL DATA:  Altered mental status. EXAM: CT HEAD WITHOUT CONTRAST TECHNIQUE: Contiguous axial images were obtained from the base of the skull through the vertex without intravenous contrast. COMPARISON:  Head CT 01/23/2020, brain MRI 02/03/2020 FINDINGS: Brain: Brain volume is normal for age. Stable degree of periventricular and deep white matter hypodensity typical of chronic small vessel ischemia  from exams earlier this year. No intracranial hemorrhage, mass effect, or midline shift. No hydrocephalus. The basilar cisterns are patent. No evidence of territorial infarct or acute ischemia. No extra-axial or intracranial fluid collection. Vascular: No hyperdense vessel or unexpected calcification. Skull: No fracture or focal lesion. Sinuses/Orbits: Paranasal sinuses and mastoid air cells are clear. The visualized orbits are unremarkable. Bilateral cataract resection. Other: None. IMPRESSION: 1. No acute intracranial abnormality. 2. Stable chronic small vessel ischemia. Electronically Signed   By: Narda RutherfordMelanie  Sanford M.D.   On: 04/24/2020 18:06   DG Chest Port 1 View  Result Date: 04/24/2020 CLINICAL DATA:  Weakness. EXAM: PORTABLE CHEST 1 VIEW COMPARISON:  January 24, 2020 FINDINGS: Cardiomediastinal silhouette is normal. Mediastinal contours appear intact. There is  no evidence of focal airspace consolidation, pleural effusion or pneumothorax. Osseous structures are without acute abnormality. Left breast calcifications are noted. IMPRESSION: No active disease. Electronically Signed   By: Ted Mcalpine M.D.   On: 04/24/2020 17:39    EKG None  Radiology CT HEAD WO CONTRAST  Result Date: 04/24/2020 CLINICAL DATA:  Altered mental status. EXAM: CT HEAD WITHOUT CONTRAST TECHNIQUE: Contiguous axial images were obtained from the base of the skull through the vertex without intravenous contrast. COMPARISON:  Head CT 01/23/2020, brain MRI 02/03/2020 FINDINGS: Brain: Brain volume is normal for age. Stable degree of periventricular and deep white matter hypodensity typical of chronic small vessel ischemia from exams earlier this year. No intracranial hemorrhage, mass effect, or midline shift. No hydrocephalus. The basilar cisterns are patent. No evidence of territorial infarct or acute ischemia. No extra-axial or intracranial fluid collection. Vascular: No hyperdense vessel or unexpected calcification. Skull:  No fracture or focal lesion. Sinuses/Orbits: Paranasal sinuses and mastoid air cells are clear. The visualized orbits are unremarkable. Bilateral cataract resection. Other: None. IMPRESSION: 1. No acute intracranial abnormality. 2. Stable chronic small vessel ischemia. Electronically Signed   By: Narda Rutherford M.D.   On: 04/24/2020 18:06   DG Chest Port 1 View  Result Date: 04/24/2020 CLINICAL DATA:  Weakness. EXAM: PORTABLE CHEST 1 VIEW COMPARISON:  January 24, 2020 FINDINGS: Cardiomediastinal silhouette is normal. Mediastinal contours appear intact. There is no evidence of focal airspace consolidation, pleural effusion or pneumothorax. Osseous structures are without acute abnormality. Left breast calcifications are noted. IMPRESSION: No active disease. Electronically Signed   By: Ted Mcalpine M.D.   On: 04/24/2020 17:39    Procedures Procedures (including critical care time)  Medications Ordered in ED Medications  sodium chloride 0.9 % bolus 1,000 mL (has no administration in time range)    ED Course  I have reviewed the triage vital signs and the nursing notes.  Pertinent labs & imaging results that were available during my care of the patient were reviewed by me and considered in my medical decision making (see chart for details).    MDM Rules/Calculators/A&P                          Iv ns. Continuous pulse ox and cardiac monitoring. Stat labs and imaging.  Reviewed nursing notes and prior charts for additional history.   Labs reviewed/interpreted by me - chem normal. Glucose mildly elevated, 222. Lactate normal.   CXR reviewed/interpreted by me - no pna.  CT reviewed/interpreted by me - no hem.   Recheck pt, no change in exam. Chest cta. abd soft nt.   Narcan given as opiates listed among meds, and ms altered. No change w narcan.   On additional rechecks, pt progressively more alert and awake.  Pt denies taking any meds in overdose or in over the prescribed amount,  but does acknowledge taking one her xanax earlier, prior to ED presentation.   Additional recheck, fully awake, alert, oriented. Po fluids/food. Pt ambulates in hall w steady gait. Pt continues to deny overuse or overdose of meds. Denies depression or any thought of self harm. Pt indicates she feels ready for d/c, and requests d/c to home. Family notified.     Final Clinical Impression(s) / ED Diagnoses Final diagnoses:  None    Rx / DC Orders ED Discharge Orders    None       Cathren Laine, MD 04/26/20 1528

## 2020-04-24 NOTE — ED Notes (Signed)
Pt ambulated with one assist. Se was steady on her feet and alert

## 2020-04-24 NOTE — ED Notes (Signed)
I&O cath performed. Pt extremely sleepy and will occasionally say "Maisie Fus" and fall back to sleep.

## 2020-04-24 NOTE — Discharge Instructions (Signed)
It was our pleasure to provide your ER care today - we hope that you feel better.  Minimize use of your xanax, and make sure to never take in over the prescribed amount - discuss with your doctor a plan to slowly taper off of xanax.   Fall precautions.   Follow up with your doctor in the next few days for recheck - call office Monday to arrange appointment.   Return to ER if worse, new symptoms, fevers, new or severe pain, chest pain, trouble breathing, weak/fainting, or other concern.

## 2020-04-24 NOTE — ED Notes (Signed)
Pt more awake and alert. She is asking questions about her condition and asking where her family members are

## 2020-04-24 NOTE — ED Triage Notes (Signed)
EMS reports they have been called by son 3 times today. Required lift assistance first two times. Third time, pt was unresponsive or limited responsive. Pt cannot stand on her own. At baseline pt is able to perform ADLs and is A&O x4. Pt mentioned sore on left buttock. No obvious injury. No blood thinners.  BP 140/70 last set 100/58 HR 78 RR 17 SpO2 90% RA Temp 97.3 CBG 230

## 2020-04-29 ENCOUNTER — Telehealth: Payer: Self-pay | Admitting: Cardiology

## 2020-04-29 NOTE — Telephone Encounter (Signed)
Pt called in and wanted to sch an appt with DR Revankar but unable to sch due to pt chart is marked "dismissed" back from 2014.  If this is still a pt of dr Tomie China , can that be removed?  Thank you

## 2020-04-30 NOTE — Telephone Encounter (Signed)
Pt has been scheduled for 06/01/20.

## 2020-05-07 ENCOUNTER — Ambulatory Visit: Payer: Medicare Other | Admitting: Cardiology

## 2020-05-31 ENCOUNTER — Other Ambulatory Visit: Payer: Self-pay

## 2020-05-31 DIAGNOSIS — F32A Depression, unspecified: Secondary | ICD-10-CM | POA: Insufficient documentation

## 2020-05-31 DIAGNOSIS — M791 Myalgia, unspecified site: Secondary | ICD-10-CM

## 2020-05-31 DIAGNOSIS — F419 Anxiety disorder, unspecified: Secondary | ICD-10-CM | POA: Insufficient documentation

## 2020-05-31 DIAGNOSIS — E039 Hypothyroidism, unspecified: Secondary | ICD-10-CM | POA: Insufficient documentation

## 2020-05-31 DIAGNOSIS — Z79899 Other long term (current) drug therapy: Secondary | ICD-10-CM

## 2020-05-31 DIAGNOSIS — I10 Essential (primary) hypertension: Secondary | ICD-10-CM | POA: Insufficient documentation

## 2020-06-01 ENCOUNTER — Ambulatory Visit: Payer: Medicare Other | Admitting: Cardiology

## 2020-06-01 ENCOUNTER — Other Ambulatory Visit: Payer: Self-pay

## 2020-06-01 ENCOUNTER — Encounter: Payer: Self-pay | Admitting: Cardiology

## 2020-06-01 VITALS — BP 124/62 | HR 74 | Ht 60.0 in | Wt 186.0 lb

## 2020-06-01 DIAGNOSIS — E088 Diabetes mellitus due to underlying condition with unspecified complications: Secondary | ICD-10-CM | POA: Diagnosis not present

## 2020-06-01 DIAGNOSIS — I10 Essential (primary) hypertension: Secondary | ICD-10-CM

## 2020-06-01 DIAGNOSIS — Z6836 Body mass index (BMI) 36.0-36.9, adult: Secondary | ICD-10-CM

## 2020-06-01 DIAGNOSIS — R0600 Dyspnea, unspecified: Secondary | ICD-10-CM

## 2020-06-01 DIAGNOSIS — E782 Mixed hyperlipidemia: Secondary | ICD-10-CM | POA: Diagnosis not present

## 2020-06-01 DIAGNOSIS — R0609 Other forms of dyspnea: Secondary | ICD-10-CM

## 2020-06-01 DIAGNOSIS — R011 Cardiac murmur, unspecified: Secondary | ICD-10-CM

## 2020-06-01 NOTE — Progress Notes (Signed)
Cardiology Office Note:    Date:  06/01/2020   ID:  Kayla Horne, DOB 02/24/1946, MRN [id number]  PCP:  Gordan Payment., MD  Cardiologist:  Garwin Brothers, MD   Referring MD: Gordan Payment., MD    ASSESSMENT:    1. Essential hypertension   2. Mixed hyperlipidemia   3. Class 2 severe obesity due to excess calories with serious comorbidity and body mass index (BMI) of 36.0 to 36.9 in adult (HCC)   4. Diabetes mellitus due to underlying condition with unspecified complications (HCC)    PLAN:    In order of problems listed above:  1. Primary prevention stressed with the patient.  Importance of compliance with diet medication stressed and she vocalized understanding. 2. Essential hypertension: Blood pressure stable and diet was emphasized and lifestyle modification. 3. Mixed dyslipidemia: On statin therapy and managed by primary care physician.  Diet emphasized. 4. Obesity: Weight reduction was stressed risks of obesity explained to the patient. 5. Cardiac murmur: Echocardiogram will be done to assess murmur heard on auscultation. 6. Chest discomfort: Atypical however in view of multiple risk factors we will do a Lexiscan sestamibi.  She knows to go to the nearest emergency room for any concerning symptoms. 7. Patient will be seen in follow-up appointment in 6 months or earlier if the patient has any concerns    Medication Adjustments/Labs and Tests Ordered: Current medicines are reviewed at length with the patient today.  Concerns regarding medicines are outlined above.  No orders of the defined types were placed in this encounter.  No orders of the defined types were placed in this encounter.    No chief complaint on file.    History of Present Illness:    Kayla Horne is a 74 y.o. female.  Patient has past medical history of essential hypertension mixed dyslipidemia and diabetes mellitus.  She has renal insufficiency.  She denies any problems at this time  and takes care of activities of daily living.  No chest pain orthopnea or PND.  She has been to the hospital because of drowsiness and altered mental status I reviewed those records and followed by her primary care physician.  At the time of my evaluation, the patient is alert awake oriented and in no distress.  She complains of occasional chest tightness not related to exertion.  She says that this happens sporadically probably once every couple of months with no particular stress or precipitation.  Her son accompanies her for this visit.  Past Medical History:  Diagnosis Date  . Abnormal gait 09/16/2015   Last Assessment & Plan:  She is better from this with her gait and encourage her to continue with this  Last Assessment & Plan:  Formatting of this note might be different from the original. She is better from this with her gait and encourage her to continue with this  . Acquired hypothyroidism 09/16/2015   Last Assessment & Plan:  This is stable for her and will see her level  Last Assessment & Plan:  Formatting of this note might be different from the original. This is stable for her and will see her level  . Anxiety   . Anxiety disorder 09/16/2015   Last Assessment & Plan:  Long discussion with her about her mood and would not change her med from this and she is depressed and anxious and crying which she refuses to have psych see her and her son is very supportive of her  Last Assessment & Plan:  Formatting of this note might be different from the original. Long discussion with her about her mood and would not change her med from this and she  . Arthritis 09/16/2015   Last Assessment & Plan:  Relevant Hx: Course: Daily Update: Today's Plan:she has had multiple orthopedic procedures done to her back and her knees with continued pain and disability for the same following with ortho still  Electronically signed by: Krystal Clark, NP 09/17/15 2158  . Bradycardia 11/14/2018   Revenkar  .  Callus of foot 11/13/2016  . Chronic bronchitis (HCC) 05/03/2018   No formal diagnosis of COPD  Formatting of this note might be different from the original. No formal diagnosis of COPD  . Class 2 severe obesity due to excess calories with serious comorbidity and body mass index (BMI) of 36.0 to 36.9 in adult (HCC) 09/16/2015  . Contracture of both Achilles tendons 10/29/2017  . Cough 02/11/2008   Qualifier: Diagnosis of  By: Maple Hudson MD, Clinton D   . Degeneration of lumbar intervertebral disc 10/12/2008   Qualifier: Diagnosis of  By: Maple Hudson MD, Clinton D   Last Assessment & Plan:  Formatting of this note might be different from the original. This is as stable as she can be and she did have surgery still following with Dr. Noel Gerold for this  . DEGENERATIVE DISC DISEASE 10/12/2008   Qualifier: Diagnosis of  By: Maple Hudson MD, Clinton D   . Depression   . Diabetes mellitus due to underlying condition with unspecified complications (HCC) 12/25/2018  . DIABETES, TYPE 2 02/16/2008   Qualifier: Diagnosis of  By: Maple Hudson MD, Clinton D   . Dysphagia 11/14/2018   Charm Barges  . Esophageal reflux 02/11/2008   Centricity Description: GASTROESOPHAGEAL REFLUX DISEASE, CHRONIC Qualifier: Diagnosis of  By: Maple Hudson MD, Rennis Chris  Centricity Description: Gean Maidens D Qualifier: Diagnosis of  By: Maple Hudson MD, Clinton D   . Essential hypertension 09/16/2015   Last Assessment & Plan:  This is stable for her and will follow  Last Assessment & Plan:  Formatting of this note might be different from the original. This is stable for her and will follow  . Essential tremor 09/16/2015   Last Assessment & Plan:  Relevant Hx: Course: Daily Update: Today's Plan:they think this is better though still present on exam today and on beta blocker and has seen neuro recently  Electronically signed by: Krystal Clark, NP 09/17/15 2156  Last Assessment & Plan:  Formatting of this note might be different from the original. Relevant Hx: Course: Daily Update:  Today's Plan:they think  . Fibromyalgia 09/16/2015   Last Assessment & Plan:  This is off and on for her but she feels since having her back surgery it is better for her  Last Assessment & Plan:  Relevant Hx: Course: Daily Update: Today's Plan:tied to her severe DDD which she states she is planning to have back surgery for on 10/08/15  Electronically signed by: Krystal Clark, NP 09/17/15 2157  Last Assessment & Plan:  Formatting of this   . Forgetfulness 12/01/2019  . Gastroesophageal reflux disease without esophagitis 02/11/2008   Centricity Description: GASTROESOPHAGEAL REFLUX DISEASE, CHRONIC Qualifier: Diagnosis of  By: Maple Hudson MD, Rennis Chris  Centricity Description: Gean Maidens D Qualifier: Diagnosis of  By: Maple Hudson MD, Clinton D   Last Assessment & Plan:  Formatting of this note might be different from the original. This is stable for her overall  .  Hallux valgus 09/16/2015  . Hemoptysis 10/12/2008   Qualifier: Diagnosis of  By: Maple Hudson MD, Clinton D   . High risk medication use 09/16/2015  . History of ischemic vertebrobasilar artery thalamic stroke 09/17/2015   Last Assessment & Plan:  Relevant Hx: Course: Daily Update: Today's Plan:seen on MRI brain 2017 started on statin advised her to follow up on her sugars as well  Electronically signed by: Krystal Clark, NP 09/17/15 2210  Last Assessment & Plan:  Formatting of this note might be different from the original. Relevant Hx: Course: Daily Update: Today's Plan:seen on MRI brain 2017 started   . History of ventral hernia repair 11/20/2019  . Hyperlipidemia 09/16/2015   Last Assessment & Plan:  She is going to have lipids checked today  Last Assessment & Plan:  Formatting of this note might be different from the original. She is going to have lipids checked today  . Hypertension   . Hypothyroidism   . Laceration of scalp 09/06/2016  . Localized edema 03/18/2018   Formatting of this note might be different from the original. Secondary to  amlodipine.  Toma Deiters and fatigue 09/10/2019  . Myalgia 09/16/2015  . Neck mass 09/06/2016  . Obesity 09/16/2015  . Onychomycosis due to dermatophyte 11/13/2016  . Osteoarthritis of left knee 09/10/2019  . Pain in left knee 04/11/2018  . Parkinsonism (HCC) 09/09/2015  . Polyneuropathy 09/16/2015   Last Assessment & Plan:  She is having stablity of this she states  Last Assessment & Plan:  Formatting of this note might be different from the original. She is having stablity of this she states  . Primary osteoarthritis of right shoulder 09/05/2018  . Recurrent major depressive disorder (HCC) 09/16/2015   Last Assessment & Plan:  She is to continue with her current meds and discussed with her and her son she has to try and get out more and be active and not sit at home dwelling on things she cannot change with her health  Last Assessment & Plan:  Formatting of this note might be different from the original. She is to continue with her current meds and discussed with her and her son she has to try a  . Recurrent UTI 01/14/2019  . Restless leg syndrome 09/09/2015  . RLS (restless legs syndrome) 09/09/2015   Last Assessment & Plan:  Formatting of this note might be different from the original. Relevant Hx: Course: Daily Update: Today's Plan:she feels this is more stable for her today  Electronically signed by: Krystal Clark, NP 09/17/15 2204  . S/P TKR (total knee replacement) using cement, right 06/16/2019  . Stage 3a chronic kidney disease (HCC) 06/27/2018  . Tremor 09/09/2015  . Type 2 diabetes mellitus with stage 3 chronic kidney disease, with long-term current use of insulin (HCC) 09/17/2015   Last Assessment & Plan:  Formatting of this note might be different from the original. She has been all over the place and she admits she is not eating what she should and had a reading of 500 and low has not been more than 200 for her, and her sensation is better she had admitted she has to take this more  seriously  . Vitamin D deficiency 03/01/2017    Past Surgical History:  Procedure Laterality Date  . ABDOMINAL HYSTERECTOMY    . BREAST REDUCTION SURGERY    . CERVICAL SPINE SURGERY    . CHOLECYSTECTOMY    . KNEE SURGERY     Arthroscopic right  .  LUMBAR DISC SURGERY  2017   x 2  . TOTAL KNEE ARTHROPLASTY Right 06/16/2019   Procedure: TOTAL KNEE ARTHROPLASTY;  Surgeon: Dannielle HuhLucey, Steve, MD;  Location: [facility];  Service: Orthopedics;  Laterality: Right;  75 mins needed for length of case    Current Medications: Current Meds  Medication Sig  . ALPRAZolam (XANAX) 0.5 MG tablet Take 0.25 mg by mouth every 8 (eight) hours as needed.  Marland Kitchen. amLODipine (NORVASC) 10 MG tablet Take 5 mg by mouth 2 (two) times daily.   Marland Kitchen. aspirin EC 81 MG tablet Take 81 mg by mouth daily.  . citalopram (CELEXA) 20 MG tablet Take 20 mg by mouth daily.  . furosemide (LASIX) 20 MG tablet Take 40 mg by mouth daily.  . insulin NPH-regular Human (NOVOLIN 70/30) (70-30) 100 UNIT/ML injection Inject 35 Units into the skin 2 (two) times daily with a meal.   . levothyroxine (SYNTHROID) 100 MCG tablet Take 100 mcg by mouth daily.   Marland Kitchen. losartan (COZAAR) 100 MG tablet Take 100 mg by mouth daily.  . nitroGLYCERIN (NITROSTAT) 0.4 MG SL tablet Place 1 tablet (0.4 mg total) under the tongue every 5 (five) minutes as needed.  . NYSTATIN powder Apply 1 application topically 2 (two) times daily as needed for rash. Apply to tail bone.  Marland Kitchen. oxybutynin (DITROPAN) 5 MG tablet Take 5 mg by mouth 2 (two) times daily.  . pantoprazole (PROTONIX) 40 MG tablet Take 40 mg by mouth daily.  . pramipexole (MIRAPEX) 0.5 MG tablet Take 0.5 mg by mouth 3 (three) times daily.   . pravastatin (PRAVACHOL) 20 MG tablet Take 20 mg by mouth daily.     Allergies:   Codeine sulfate, Morphine, Latex, and Tape   Social History   Socioeconomic History  . Marital status: Single    Spouse name: Not on file  . Number of children: 1  . Years of education: 5612  .  Highest education level: Not on file  Occupational History  . Occupation: retired  Tobacco Use  . Smoking status: Current Some Day Smoker    Packs/day: 0.25    Types: Cigarettes  . Smokeless tobacco: Never Used  . Tobacco comment: 5 -7 cigarettes a week  Substance and Sexual Activity  . Alcohol use: No  . Drug use: No  . Sexual activity: Not on file  Other Topics Concern  . Not on file  Social History Narrative   Lives with two dogs and two cats.  One adopted son.        Patient does not drink caffeine.   Patient is right handed.    Social Determinants of Health   Financial Resource Strain:   . Difficulty of Paying Living Expenses: Not on file  Food Insecurity:   . Worried About Programme researcher, broadcasting/film/videounning Out of Food in the Last Year: Not on file  . Ran Out of Food in the Last Year: Not on file  Transportation Needs:   . Lack of Transportation (Medical): Not on file  . Lack of Transportation (Non-Medical): Not on file  Physical Activity:   . Days of Exercise per Week: Not on file  . Minutes of Exercise per Session: Not on file  Stress:   . Feeling of Stress : Not on file  Social Connections:   . Frequency of Communication with Friends and Family: Not on file  . Frequency of Social Gatherings with Friends and Family: Not on file  . Attends Religious Services: Not on file  . Active  Member of Clubs or Organizations: Not on file  . Attends Banker Meetings: Not on file  . Marital Status: Not on file     Family History: The patient's family history includes CAD in her father; CAD (age of onset: 75) in her brother; CAD (age of onset: 39) in her brother; Restless legs syndrome in her mother; Stroke (age of onset: 61) in her father. There is no history of Tremor.  ROS:   Please see the history of present illness.    All other systems reviewed and are negative.  EKGs/Labs/Other Studies Reviewed:    The following studies were reviewed today: I reviewed records from the  hospital.   Recent Labs: 04/24/2020: ALT 51; BUN 23; Creatinine, Ser 1.01; Hemoglobin 14.2; Platelets 173; Potassium 4.0; Sodium 138  Recent Lipid Panel No results found for: CHOL, TRIG, HDL, CHOLHDL, VLDL, LDLCALC, LDLDIRECT  Physical Exam:    VS:  BP 124/62   Pulse 74   Ht 5' (1.524 m)   Wt 186 lb (84.4 kg)   SpO2 96%   BMI 36.33 kg/m     Wt Readings from Last 3 Encounters:  06/01/20 186 lb (84.4 kg)  06/16/19 189 lb 9 oz (86 kg)  06/13/19 189 lb 9 oz (86 kg)     GEN: Patient is in no acute distress HEENT: Normal NECK: No JVD; No carotid bruits LYMPHATICS: No lymphadenopathy CARDIAC: Hear sounds regular, 2/6 systolic murmur at the apex. RESPIRATORY:  Clear to auscultation without rales, wheezing or rhonchi  ABDOMEN: Soft, non-tender, non-distended MUSCULOSKELETAL:  No edema; No deformity  SKIN: Warm and dry NEUROLOGIC:  Alert and oriented x 3 PSYCHIATRIC:  Normal affect   Signed, Garwin Brothers, MD  06/01/2020 2:36 PM    [facility]

## 2020-06-01 NOTE — Patient Instructions (Addendum)
Medication Instructions:  No medication changes. *If you need a refill on your cardiac medications before your next appointment, please call your pharmacy*   Lab Work: None ordered If you have labs (blood work) drawn today and your tests are completely normal, you will receive your results only by: . MyChart Message (if you have MyChart) OR . A paper copy in the mail If you have any lab test that is abnormal or we need to change your treatment, we will call you to review the results.   Testing/Procedures: Your physician has requested that you have an echocardiogram. Echocardiography is a painless test that uses sound waves to create images of your heart. It provides your doctor with information about the size and shape of your heart and how well your heart's chambers and valves are working. This procedure takes approximately one hour. There are no restrictions for this procedure.  Your physician has requested that you have a lexiscan myoview. For further information please visit [email or web address]. Please follow instruction sheet, as given.  The test will take approximately 3 to 4 hours to complete; you may bring reading material.  If someone comes with you to your appointment, they will need to remain in the main lobby due to limited space in the testing area. **If you are pregnant or breastfeeding, please notify the nuclear lab prior to your appointment**  How to prepare for your Myocardial Perfusion Test: . Do not eat or drink 3 hours prior to your test, except you may have water. . Do not consume products containing caffeine (regular or decaffeinated) 12 hours prior to your test. (ex: coffee, chocolate, sodas, tea). . Do bring a list of your current medications with you.  If not listed below, you may take your medications as normal. . Do wear comfortable clothes (no dresses or overalls) and walking shoes, tennis shoes preferred (No heels or open toe shoes are allowed). . Do NOT wear  cologne, perfume, aftershave, or lotions (deodorant is allowed). . If these instructions are not followed, your test will have to be rescheduled.    Follow-Up: At [facility], you and your health needs are our priority.  As part of our continuing mission to provide you with exceptional heart care, we have created designated Provider Care Teams.  These Care Teams include your primary Cardiologist (physician) and Advanced Practice Providers (APPs -  Physician Assistants and Nurse Practitioners) who all work together to provide you with the care you need, when you need it.  We recommend signing up for the patient portal called "MyChart".  Sign up information is provided on this After Visit Summary.  MyChart is used to connect with patients for Virtual Visits (Telemedicine).  Patients are able to view lab/test results, encounter notes, upcoming appointments, etc.  Non-urgent messages can be sent to your provider as well.   To learn more about what you can do with MyChart, go to [email or web address].    Your next appointment:   6 month(s)  The format for your next appointment:   In Person  Provider:   Rajan Revankar, MD   Other Instructions  Cardiac Nuclear Scan  A cardiac nuclear scan is a test that is done to check the flow of blood to your heart. It is done when you are resting and when you are exercising. The test looks for problems such as:  Not enough blood reaching a portion of the heart.  The heart muscle not working as it should. You may need this test if:    You have heart disease.  You have had lab results that are not normal.  You have had heart surgery or a balloon procedure to open up blocked arteries (angioplasty).  You have chest pain.  You have shortness of breath. In this test, a special dye (tracer) is put into your bloodstream. The tracer will travel to your heart. A camera will then take pictures of your heart to see how the tracer moves through your  heart. This test is usually done at a hospital and takes 2-4 hours. Tell a doctor about:  Any allergies you have.  All medicines you are taking, including vitamins, herbs, eye drops, creams, and over-the-counter medicines.  Any problems you or family members have had with anesthetic medicines.  Any blood disorders you have.  Any surgeries you have had.  Any medical conditions you have.  Whether you are pregnant or may be pregnant. What are the risks? Generally, this is a safe test. However, problems may occur, such as:  Serious chest pain and heart attack. This is only a risk if the stress portion of the test is done.  Rapid heartbeat.  A feeling of warmth in your chest. This feeling usually does not last long.  Allergic reaction to the tracer. What happens before the test?  Ask your doctor about changing or stopping your normal medicines. This is important.  Follow instructions from your doctor about what you cannot eat or drink.  Remove your jewelry on the day of the test. What happens during the test? 1. An IV tube will be inserted into one of your veins. 2. Your doctor will give you a small amount of tracer through the IV tube. 3. You will wait for 20-40 minutes while the tracer moves through your bloodstream. 4. Your heart will be monitored with an electrocardiogram (ECG). 5. You will lie down on an exam table. 6. Pictures of your heart will be taken for about 15-20 minutes. 7. You may also have a stress test. For this test, one of these things may be done: ? You will be asked to exercise on a treadmill or a stationary bike. ? You will be given medicines that will make your heart work harder. This is done if you are unable to exercise. 8. When blood flow to your heart has peaked, a tracer will again be given through the IV tube. 9. After 20-40 minutes, you will get back on the exam table. More pictures will be taken of your heart. 10. Depending on the tracer that is  used, more pictures may need to be taken 3-4 hours later. 11. Your IV tube will be removed when the test is over. The test may vary among doctors and hospitals. What happens after the test? 1. Ask your doctor: ? Whether you can return to your normal schedule, including diet, activities, and medicines. ? Whether you should drink more fluids. This will help to remove the tracer from your body. Drink enough fluid to keep your pee (urine) pale yellow. 2. Ask your doctor, or the department that is doing the test: ? When will my results be ready? ? How will I get my results? Summary  A cardiac nuclear scan is a test that is done to check the flow of blood to your heart.  Tell your doctor whether you are pregnant or may be pregnant.  Before the test, ask your doctor about changing or stopping your normal medicines. This is important.  Ask your doctor whether you can return   to your normal activities. You may be asked to drink more fluids. This information is not intended to replace advice given to you by your health care provider. Make sure you discuss any questions you have with your health care provider. Document Revised: 11/06/2018 Document Reviewed: 12/31/2017 Elsevier Patient Education  2020 [employer]  Echocardiogram An echocardiogram is a procedure that uses painless sound waves (ultrasound) to produce an image of the heart. Images from an echocardiogram can provide important information about:  Signs of coronary artery disease (CAD).  Aneurysm detection. An aneurysm is a weak or damaged part of an artery wall that bulges out from the normal force of blood pumping through the body.  Heart size and shape. Changes in the size or shape of the heart can be associated with certain conditions, including heart failure, aneurysm, and CAD.  Heart muscle function.  Heart valve function.  Signs of a past heart attack.  Fluid buildup around the heart.  Thickening of the heart  muscle.  A tumor or infectious growth around the heart valves. Tell a health care provider about:  Any allergies you have.  All medicines you are taking, including vitamins, herbs, eye drops, creams, and over-the-counter medicines.  Any blood disorders you have.  Any surgeries you have had.  Any medical conditions you have.  Whether you are pregnant or may be pregnant. What are the risks? Generally, this is a safe procedure. However, problems may occur, including:  Allergic reaction to dye (contrast) that may be used during the procedure. What happens before the procedure? No specific preparation is needed. You may eat and drink normally. What happens during the procedure?    An IV tube may be inserted into one of your veins.  You may receive contrast through this tube. A contrast is an injection that improves the quality of the pictures from your heart.  A gel will be applied to your chest.  A wand-like tool (transducer) will be moved over your chest. The gel will help to transmit the sound waves from the transducer.  The sound waves will harmlessly bounce off of your heart to allow the heart images to be captured in real-time motion. The images will be recorded on a computer. The procedure may vary among health care providers and hospitals. What happens after the procedure?  You may return to your normal, everyday life, including diet, activities, and medicines, unless your health care provider tells you not to do that. Summary  An echocardiogram is a procedure that uses painless sound waves (ultrasound) to produce an image of the heart.  Images from an echocardiogram can provide important information about the size and shape of your heart, heart muscle function, heart valve function, and fluid buildup around your heart.  You do not need to do anything to prepare before this procedure. You may eat and drink normally.  After the echocardiogram is completed, you may  return to your normal, everyday life, unless your health care provider tells you not to do that. This information is not intended to replace advice given to you by your health care provider. Make sure you discuss any questions you have with your health care provider. Document Revised: 11/07/2018 Document Reviewed: 08/19/2016 Elsevier Patient Education  2020 [employer]  

## 2020-06-02 ENCOUNTER — Telehealth (HOSPITAL_COMMUNITY): Payer: Self-pay | Admitting: *Deleted

## 2020-06-02 NOTE — Telephone Encounter (Signed)
Patient given detailed instructions per Myocardial Perfusion Study Information Sheet for the test on 06/08/20 at 11:15. Patient notified to arrive 15 minutes early and that it is imperative to arrive on time for appointment to keep from having the test rescheduled.  If you need to cancel or reschedule your appointment, please call the office within 24 hours of your appointment. . Patient verbalized understanding.Kayla Horne

## 2020-06-08 ENCOUNTER — Ambulatory Visit (INDEPENDENT_AMBULATORY_CARE_PROVIDER_SITE_OTHER): Payer: Medicare Other

## 2020-06-08 ENCOUNTER — Other Ambulatory Visit: Payer: Self-pay

## 2020-06-08 DIAGNOSIS — R0609 Other forms of dyspnea: Secondary | ICD-10-CM

## 2020-06-08 DIAGNOSIS — R0600 Dyspnea, unspecified: Secondary | ICD-10-CM

## 2020-06-08 MED ORDER — REGADENOSON 0.4 MG/5ML IV SOLN
0.4000 mg | Freq: Once | INTRAVENOUS | Status: AC
  Administered 2020-06-08: 0.4 mg via INTRAVENOUS

## 2020-06-08 MED ORDER — TECHNETIUM TC 99M TETROFOSMIN IV KIT
32.1000 | PACK | Freq: Once | INTRAVENOUS | Status: AC | PRN
  Administered 2020-06-08: 32.1 via INTRAVENOUS

## 2020-06-08 MED ORDER — TECHNETIUM TC 99M TETROFOSMIN IV KIT
11.0000 | PACK | Freq: Once | INTRAVENOUS | Status: AC | PRN
  Administered 2020-06-08: 11 via INTRAVENOUS

## 2020-06-09 LAB — MYOCARDIAL PERFUSION IMAGING
LV dias vol: 96 mL (ref 46–106)
LV sys vol: 35 mL
Peak HR: 77 {beats}/min
Rest HR: 48 {beats}/min
SDS: 1
SRS: 0
SSS: 1
TID: 1.08

## 2020-06-14 DIAGNOSIS — Z6834 Body mass index (BMI) 34.0-34.9, adult: Secondary | ICD-10-CM | POA: Insufficient documentation

## 2020-06-14 HISTORY — DX: Body mass index (BMI) 34.0-34.9, adult: Z68.34

## 2020-06-17 ENCOUNTER — Ambulatory Visit (INDEPENDENT_AMBULATORY_CARE_PROVIDER_SITE_OTHER): Payer: Medicare Other

## 2020-06-17 ENCOUNTER — Other Ambulatory Visit: Payer: Self-pay

## 2020-06-17 DIAGNOSIS — R011 Cardiac murmur, unspecified: Secondary | ICD-10-CM | POA: Diagnosis not present

## 2020-06-17 LAB — ECHOCARDIOGRAM COMPLETE
Area-P 1/2: 2.66 cm2
S' Lateral: 3.3 cm

## 2020-06-17 NOTE — Progress Notes (Signed)
Complete echocardiogram has been performed.  Jimmy Omayra Tulloch RDCS, RVT 

## 2020-09-17 ENCOUNTER — Telehealth: Payer: Self-pay | Admitting: Cardiology

## 2020-09-17 NOTE — Telephone Encounter (Signed)
Spoke with patient regarding results and recommendation.  Patient verbalizes understanding and is agreeable to plan of care. Advised patient to call back with any issues or concerns.  

## 2020-09-17 NOTE — Telephone Encounter (Signed)
Patient is requesting to go over 06/17/20 echo results again.

## 2020-09-27 DIAGNOSIS — E1142 Type 2 diabetes mellitus with diabetic polyneuropathy: Secondary | ICD-10-CM

## 2020-09-27 DIAGNOSIS — L603 Nail dystrophy: Secondary | ICD-10-CM

## 2020-09-27 DIAGNOSIS — L84 Corns and callosities: Secondary | ICD-10-CM

## 2020-09-27 HISTORY — DX: Corns and callosities: L84

## 2020-09-27 HISTORY — DX: Nail dystrophy: L60.3

## 2020-09-27 HISTORY — DX: Type 2 diabetes mellitus with diabetic polyneuropathy: E11.42

## 2020-12-01 ENCOUNTER — Other Ambulatory Visit: Payer: Self-pay

## 2020-12-10 ENCOUNTER — Encounter: Payer: Self-pay | Admitting: Cardiology

## 2020-12-10 ENCOUNTER — Ambulatory Visit: Payer: Medicare Other | Admitting: Cardiology

## 2020-12-10 ENCOUNTER — Other Ambulatory Visit: Payer: Self-pay

## 2020-12-10 VITALS — BP 128/56 | HR 100 | Ht 62.0 in | Wt 184.4 lb

## 2020-12-10 DIAGNOSIS — I10 Essential (primary) hypertension: Secondary | ICD-10-CM | POA: Diagnosis not present

## 2020-12-10 DIAGNOSIS — R0609 Other forms of dyspnea: Secondary | ICD-10-CM

## 2020-12-10 DIAGNOSIS — E088 Diabetes mellitus due to underlying condition with unspecified complications: Secondary | ICD-10-CM

## 2020-12-10 DIAGNOSIS — Z01810 Encounter for preprocedural cardiovascular examination: Secondary | ICD-10-CM

## 2020-12-10 DIAGNOSIS — R0600 Dyspnea, unspecified: Secondary | ICD-10-CM

## 2020-12-10 HISTORY — DX: Encounter for preprocedural cardiovascular examination: Z01.810

## 2020-12-10 NOTE — Progress Notes (Signed)
Cardiology Office Note:    Date:  12/10/2020   ID:  RUWAYDA CURET, DOB Nov 16, 1945, MRN [id number]  PCP:  Gordan Payment., MD  Cardiologist:  Garwin Brothers, MD   Referring MD: Gordan Payment., MD    ASSESSMENT:    1. DOE (dyspnea on exertion)   2. Preoperative cardiovascular examination   3. Essential hypertension   4. Diabetes mellitus due to underlying condition with unspecified complications (HCC)    PLAN:    In order of problems listed above:  1. Preoperative risk stratification from a cardiovascular standpoint: I discussed my findings with the patient at length.  Echocardiogram and stress test report was discussed with the patient at length.  They were unremarkable.  Patient ambulates with a cane and has no symptoms with this.  In view of this I told her that she is not at high risk for coronary events during the aforementioned surgery.  She is asymptomatic.  Medical hemodynamic monitoring will further reduce the risk of coronary events. 2. Essential hypertension: Blood pressure stable and diet was emphasized. 3. Diabetes mellitus and obesity: Weight reduction was stressed.  Diet was emphasized and she promises to do better. 4. Mixed dyslipidemia: On statin therapy and managed by primary care.  Diet emphasized. 5. Patient will be seen in follow-up appointment in 6 months or earlier if the patient has any concerns    Medication Adjustments/Labs and Tests Ordered: Current medicines are reviewed at length with the patient today.  Concerns regarding medicines are outlined above.  No orders of the defined types were placed in this encounter.  No orders of the defined types were placed in this encounter.    No chief complaint on file.    History of Present Illness:    Kayla Horne is a 75 y.o. female.  Patient has past medical history of essential hypertension, dyslipidemia and diabetes mellitus.  She is morbidly obese and leads a sedentary lifestyle.  She  denies any chest pain orthopnea or PND.  She takes care of activities of daily living.  She ambulates with a cane.  She is planning to undergo surgery to reduce fat in her abdomen.  At the time of my evaluation, the patient is alert awake oriented and in no distress.  Past Medical History:  Diagnosis Date  . Abnormal gait 09/16/2015   Last Assessment & Plan:  She is better from this with her gait and encourage her to continue with this  Last Assessment & Plan:  Formatting of this note might be different from the original. She is better from this with her gait and encourage her to continue with this  . Acquired hypothyroidism 09/16/2015   Last Assessment & Plan:  This is stable for her and will see her level  Last Assessment & Plan:  Formatting of this note might be different from the original. This is stable for her and will see her level  . Anxiety   . Anxiety disorder 09/16/2015   Last Assessment & Plan:  Long discussion with her about her mood and would not change her med from this and she is depressed and anxious and crying which she refuses to have psych see her and her son is very supportive of her  Last Assessment & Plan:  Formatting of this note might be different from the original. Long discussion with her about her mood and would not change her med from this and she  . Arthritis 09/16/2015   Last Assessment &  Plan:  Relevant Hx: Course: Daily Update: Today's Plan:she has had multiple orthopedic procedures done to her back and her knees with continued pain and disability for the same following with ortho still  Electronically signed by: Krystal Clark, NP 09/17/15 2158  . BMI 34.0-34.9,adult 06/14/2020  . Bradycardia 11/14/2018   Revenkar  . Callus of foot 11/13/2016  . Chronic bronchitis (HCC) 05/03/2018   No formal diagnosis of COPD  Formatting of this note might be different from the original. No formal diagnosis of COPD  . Class 2 severe obesity due to excess calories with  serious comorbidity and body mass index (BMI) of 36.0 to 36.9 in adult (HCC) 09/16/2015  . Contracture of both Achilles tendons 10/29/2017  . Cough 02/11/2008   Qualifier: Diagnosis of  By: Maple Hudson MD, Clinton D   . Degeneration of lumbar intervertebral disc 10/12/2008   Qualifier: Diagnosis of  By: Maple Hudson MD, Clinton D   Last Assessment & Plan:  Formatting of this note might be different from the original. This is as stable as she can be and she did have surgery still following with Dr. Noel Gerold for this  . DEGENERATIVE DISC DISEASE 10/12/2008   Qualifier: Diagnosis of  By: Maple Hudson MD, Clinton D   . Depression   . Diabetes mellitus due to underlying condition with unspecified complications (HCC) 12/25/2018  . DIABETES, TYPE 2 02/16/2008   Qualifier: Diagnosis of  By: Maple Hudson MD, Clinton D   . Diabetic peripheral neuropathy (HCC) 09/27/2020  . Dysphagia 11/14/2018   Charm Barges  . Esophageal reflux 02/11/2008   Centricity Description: GASTROESOPHAGEAL REFLUX DISEASE, CHRONIC Qualifier: Diagnosis of  By: Maple Hudson MD, Rennis Chris  Centricity Description: Gean Maidens D Qualifier: Diagnosis of  By: Maple Hudson MD, Clinton D   . Essential hypertension 09/16/2015   Last Assessment & Plan:  This is stable for her and will follow  Last Assessment & Plan:  Formatting of this note might be different from the original. This is stable for her and will follow  . Essential tremor 09/16/2015   Last Assessment & Plan:  Relevant Hx: Course: Daily Update: Today's Plan:they think this is better though still present on exam today and on beta blocker and has seen neuro recently  Electronically signed by: Krystal Clark, NP 09/17/15 2156  Last Assessment & Plan:  Formatting of this note might be different from the original. Relevant Hx: Course: Daily Update: Today's Plan:they think  . Fibromyalgia 09/16/2015   Last Assessment & Plan:  This is off and on for her but she feels since having her back surgery it is better for her  Last Assessment & Plan:   Relevant Hx: Course: Daily Update: Today's Plan:tied to her severe DDD which she states she is planning to have back surgery for on 10/08/15  Electronically signed by: Krystal Clark, NP 09/17/15 2157  Last Assessment & Plan:  Formatting of this   . Forgetfulness 12/01/2019  . Gastroesophageal reflux disease without esophagitis 02/11/2008   Centricity Description: GASTROESOPHAGEAL REFLUX DISEASE, CHRONIC Qualifier: Diagnosis of  By: Maple Hudson MD, Rennis Chris  Centricity Description: Gean Maidens D Qualifier: Diagnosis of  By: Maple Hudson MD, Clinton D   Last Assessment & Plan:  Formatting of this note might be different from the original. This is stable for her overall  . Hallux valgus 09/16/2015  . Hemoptysis 10/12/2008   Qualifier: Diagnosis of  By: Maple Hudson MD, Clinton D   . High risk medication use 09/16/2015  . History  of ischemic vertebrobasilar artery thalamic stroke 09/17/2015   Last Assessment & Plan:  Relevant Hx: Course: Daily Update: Today's Plan:seen on MRI brain 2017 started on statin advised her to follow up on her sugars as well  Electronically signed by: Krystal Clark, NP 09/17/15 2210  Last Assessment & Plan:  Formatting of this note might be different from the original. Relevant Hx: Course: Daily Update: Today's Plan:seen on MRI brain 2017 started   . History of ventral hernia repair 11/20/2019  . Hyperlipidemia 09/16/2015   Last Assessment & Plan:  She is going to have lipids checked today  Last Assessment & Plan:  Formatting of this note might be different from the original. She is going to have lipids checked today  . Hypertension   . Hypothyroidism   . Laceration of scalp 09/06/2016  . Localized edema 03/18/2018   Formatting of this note might be different from the original. Secondary to amlodipine.  Toma Deiters and fatigue 09/10/2019  . Metatarsalgia of both feet 10/29/2017  . 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 Myalgia 09/16/2015  . Nail dystrophy 09/27/2020  . Neck  mass 09/06/2016  . Obesity 09/16/2015  . Onychomycosis due to dermatophyte 11/13/2016  . Osteoarthritis of left knee 09/10/2019  . Pain in left knee 04/11/2018  . Panniculitis 12/05/2019  . Parkinsonism (HCC) 09/09/2015  . Polyneuropathy 09/16/2015   Last Assessment & Plan:  She is having stablity of this she states  Last Assessment & Plan:  Formatting of this note might be different from the original. She is having stablity of this she states  . Pre-ulcerative calluses 09/27/2020  . Primary osteoarthritis of right shoulder 09/05/2018  . Recurrent major depressive disorder (HCC) 09/16/2015   Last Assessment & Plan:  She is to continue with her current meds and discussed with her and her son she has to try and get out more and be active and not sit at home dwelling on things she cannot change with her health  Last Assessment & Plan:  Formatting of this note might be different from the original. She is to continue with her current meds and discussed with her and her son she has to try a  . Recurrent UTI 01/14/2019  . Restless leg syndrome 09/09/2015  . RLS (restless legs syndrome) 09/09/2015   Last Assessment & Plan:  Formatting of this note might be different from the original. Relevant Hx: Course: Daily Update: Today's Plan:she feels this is more stable for her today  Electronically signed by: Krystal Clark, NP 09/17/15 2204  . S/P TKR (total knee replacement) using cement, right 06/16/2019  . Stage 3a chronic kidney disease (HCC) 06/27/2018  . Tremor 09/09/2015  . Type 2 diabetes mellitus with stage 3 chronic kidney disease, with long-term current use of insulin (HCC) 09/17/2015   Last Assessment & Plan:  Formatting of this note might be different from the original. She has been all over the place and she admits she is not eating what she should and had a reading of 500 and low has not been more than 200 for her, and her sensation is better she had admitted she has to take this more seriously  . Vitamin  B12 deficiency 03/01/2017  . Vitamin D deficiency 03/01/2017    Past Surgical History:  Procedure Laterality Date  . ABDOMINAL HYSTERECTOMY    . BREAST REDUCTION SURGERY    . CERVICAL SPINE SURGERY    . CHOLECYSTECTOMY    . KNEE  SURGERY     Arthroscopic right  . LUMBAR DISC SURGERY  2017   x 2  . TOTAL KNEE ARTHROPLASTY Right 06/16/2019   Procedure: TOTAL KNEE ARTHROPLASTY;  Surgeon: Dannielle Huh, MD;  Location: [facility];  Service: Orthopedics;  Laterality: Right;  75 mins needed for length of case    Current Medications: Current Meds  Medication Sig  . ALPRAZolam (XANAX) 0.5 MG tablet Take 0.25 mg by mouth every 8 (eight) hours as needed for anxiety.  Marland Kitchen amLODipine (NORVASC) 10 MG tablet Take 5 mg by mouth 2 (two) times daily.   Marland Kitchen aspirin EC 81 MG tablet Take 81 mg by mouth daily.  . citalopram (CELEXA) 20 MG tablet Take 20 mg by mouth daily.  . cyanocobalamin (,VITAMIN B-12,) 1000 MCG/ML injection Inject 1,000 mcg into the muscle every 30 (thirty) days.  . fluconazole (DIFLUCAN) 100 MG tablet Take 100 mg by mouth daily.  . furosemide (LASIX) 20 MG tablet Take 40 mg by mouth daily.  . insulin NPH-regular Human (NOVOLIN 70/30) (70-30) 100 UNIT/ML injection Inject 35 Units into the skin 2 (two) times daily with a meal.   . levothyroxine (SYNTHROID) 100 MCG tablet Take 100 mcg by mouth daily.   Marland Kitchen losartan (COZAAR) 100 MG tablet Take 100 mg by mouth daily.  . metFORMIN (GLUCOPHAGE-XR) 500 MG 24 hr tablet Take 500 mg by mouth every morning.  . mupirocin ointment (BACTROBAN) 2 % Apply 1 application topically 2 (two) times daily.  . nitroGLYCERIN (NITROSTAT) 0.4 MG SL tablet Place 1 tablet under the tongue every 5 (five) minutes as needed for chest pain.  Marland Kitchen NYSTATIN powder Apply 1 application topically 2 (two) times daily as needed for rash. Apply to tail bone.  Marland Kitchen oxybutynin (DITROPAN) 5 MG tablet Take 5 mg by mouth 2 (two) times daily.  . pantoprazole (PROTONIX) 40 MG tablet Take 40 mg by  mouth daily.  . pramipexole (MIRAPEX) 0.5 MG tablet Take 0.5 mg by mouth 3 (three) times daily.   . pravastatin (PRAVACHOL) 20 MG tablet Take 20 mg by mouth daily.     Allergies:   Codeine sulfate, Morphine, Latex, and Tape   Social History   Socioeconomic History  . Marital status: Single    Spouse name: Not on file  . Number of children: 1  . Years of education: 39  . Highest education level: Not on file  Occupational History  . Occupation: retired  Tobacco Use  . Smoking status: Current Some Day Smoker    Packs/day: 0.25    Types: Cigarettes  . Smokeless tobacco: Never Used  . Tobacco comment: 5 -7 cigarettes a week  Substance and Sexual Activity  . Alcohol use: No  . Drug use: No  . Sexual activity: Not on file  Other Topics Concern  . Not on file  Social History Narrative   Lives with two dogs and two cats.  One adopted son.        Patient does not drink caffeine.   Patient is right handed.    Social Determinants of Health   Financial Resource Strain: Not on file  Food Insecurity: Not on file  Transportation Needs: Not on file  Physical Activity: Not on file  Stress: Not on file  Social Connections: Not on file     Family History: The patient's family history includes CAD in her father; CAD (age of onset: 59) in her brother; CAD (age of onset: 65) in her brother; Restless legs syndrome in her  mother; Stroke (age of onset: 7066) in her father. There is no history of Tremor.  ROS:   Please see the history of present illness.    All other systems reviewed and are negative.  EKGs/Labs/Other Studies Reviewed:    The following studies were reviewed today: I discussed my findings with the patient at length.  EKG was sinus rhythm and nonspecific ST-T changes   Recent Labs: 04/24/2020: ALT 51; BUN 23; Creatinine, Ser 1.01; Hemoglobin 14.2; Platelets 173; Potassium 4.0; Sodium 138  Recent Lipid Panel No results found for: CHOL, TRIG, HDL, CHOLHDL, VLDL, LDLCALC,  LDLDIRECT  Physical Exam:    VS:  BP (!) 128/56   Pulse 100   Ht 5\' 2"  (1.575 m)   Wt 184 lb 6.4 oz (83.6 kg)   SpO2 95%   BMI 33.73 kg/m     Wt Readings from Last 3 Encounters:  12/10/20 184 lb 6.4 oz (83.6 kg)  06/08/20 186 lb (84.4 kg)  06/01/20 186 lb (84.4 kg)     GEN: Patient is in no acute distress HEENT: Normal NECK: No JVD; No carotid bruits LYMPHATICS: No lymphadenopathy CARDIAC: Hear sounds regular, 2/6 systolic murmur at the apex. RESPIRATORY:  Clear to auscultation without rales, wheezing or rhonchi  ABDOMEN: Soft, non-tender, non-distended MUSCULOSKELETAL:  No edema; No deformity  SKIN: Warm and dry NEUROLOGIC:  Alert and oriented x 3 PSYCHIATRIC:  Normal affect   Signed, Garwin Brothersajan R Brazil Voytko, MD  12/10/2020 3:32 PM    [facility]

## 2020-12-10 NOTE — Patient Instructions (Addendum)

## 2021-01-27 NOTE — Patient Instructions (Addendum)
DUE TO COVID-19 ONLY ONE VISITOR IS ALLOWED TO COME WITH YOU AND STAY IN THE WAITING ROOM ONLY DURING PRE OP AND PROCEDURE.   **NO VISITORS ARE ALLOWED IN THE SHORT STAY AREA OR RECOVERY ROOM!!**  IF YOU WILL BE ADMITTED INTO THE HOSPITAL YOU ARE ALLOWED ONLY TWO SUPPORT PEOPLE DURING VISITATION HOURS ONLY (10AM -8PM)   The support person(s) may change daily. The support person(s) must pass our screening, gel in and out, and wear a mask at all times, including in the patient's room. Patients must also wear a mask when staff or their support person are in the room.  No visitors under the age of 76. Any visitor under the age of 77 must be accompanied by an adult.    COVID SWAB TESTING MUST BE COMPLETED ON:  02/09/21 @ 2:45 PM   [address] [place], [place] [address]   You are not required to quarantine, however you are required to wear a well-fitted mask when you are out and around people not in your household.  Hand Hygiene often Do NOT share personal items Notify your provider if you are in close contact with someone who has COVID or you develop fever 100.4 or greater, new onset of sneezing, cough, sore throat, shortness of breath or body aches.        Your procedure is scheduled on: 02/11/21   Report to [facility] Main  Entrance   Report to Short Stay at 5:15 AM   [facility])   Call this number if you have problems the morning of surgery [phone]   Do not eat food :After Midnight.   May have liquids until 4:30 AM day of surgery  CLEAR LIQUID DIET  Foods Allowed                                                                     Foods Excluded  Water, Black Coffee and tea, regular and decaf                liquids that you cannot  Plain Jell-O in any flavor  (No red)                                      see through such as: Fruit ices (not with fruit pulp)                                              milk, soups, orange juice              Iced Popsicles (No  red)                                                 All solid food  Apple juices Sports drinks like Gatorade (No red) Lightly seasoned clear broth or consume(fat free) Sugar, honey syrup       Oral Hygiene is also important to reduce your risk of infection.                                    Remember - BRUSH YOUR TEETH THE MORNING OF SURGERY WITH YOUR REGULAR TOOTHPASTE   Do NOT smoke after Midnight   Take these medicines the morning of surgery with A SIP OF WATER: Acetaminophen, Citalopram, Gabapentin, Levothyroxine, Oxubutynin, Pantoprazole, Pravastatin.   DO NOT TAKE ANY ORAL DIABETIC MEDICATIONS DAY OF YOUR SURGERY  How to Manage Your Diabetes Before and After Surgery  Why is it important to control my blood sugar before and after surgery? Improving blood sugar levels before and after surgery helps healing and can limit problems. A way of improving blood sugar control is eating a healthy diet by:  Eating less sugar and carbohydrates  Increasing activity/exercise  Talking with your doctor about reaching your blood sugar goals High blood sugars (greater than 180 mg/dL) can raise your risk of infections and slow your recovery, so you will need to focus on controlling your diabetes during the weeks before surgery. Make sure that the doctor who takes care of your diabetes knows about your planned surgery including the date and location.  How do I manage my blood sugar before surgery? Check your blood sugar at least 4 times a day, starting 2 days before surgery, to make sure that the level is not too high or low. Check your blood sugar the morning of your surgery when you wake up and every 2 hours until you get to the Short Stay unit. If your blood sugar is less than 70 mg/dL, you will need to treat for low blood sugar: Do not take insulin. Treat a low blood sugar (less than 70 mg/dL) with  cup of clear juice (cranberry or apple), 4 glucose tablets,  OR glucose gel. Recheck blood sugar in 15 minutes after treatment (to make sure it is greater than 70 mg/dL). If your blood sugar is not greater than 70 mg/dL on recheck, call [phone] for further instructions. Report your blood sugar to the short stay nurse when you get to Short Stay.  If you are admitted to the hospital after surgery: Your blood sugar will be checked by the staff and you will probably be given insulin after surgery (instead of oral diabetes medicines) to make sure you have good blood sugar levels. The goal for blood sugar control after surgery is 80-180 mg/dL.   WHAT DO I DO ABOUT MY DIABETES MEDICATION?  Do not take oral diabetes medicines (pills) the morning of surgery.  THE DAY BEFORE SURGERY, take Metformin as prescribed. Take regular morning dose of NPH insulin and 50% of evening dose       THE MORNING OF SURGERY, do not take Metformin. Take 50% of morning dose of NPH insulin.  The day of surgery, do not take other diabetes injectables, including Byetta (exenatide), Bydureon (exenatide ER), Victoza (liraglutide), or Trulicity (dulaglutide).  If your CBG is greater than 220 mg/dL, you may take  of your sliding scale  (correction) dose of insulin.      Reviewed and Endorsed by [facility] Patient Education Committee, August 2015  You may not have any metal on your body including hair pins, jewelry, and body piercing             Do not wear make-up, lotions, powders, perfumes, or deodorant  Do not wear nail polish including gel and S&S, artificial/acrylic nails, or any other type of covering on natural nails including finger and toenails. If you have artificial nails, gel coating, etc. that needs to be removed by a nail salon please have this removed prior to surgery or surgery may need to be canceled/ delayed if the surgeon/ anesthesia feels like they are unable to be safely monitored.   Do not shave  48 hours prior to surgery.     Do not bring valuables to the hospital. [facility] IS NOT             RESPONSIBLE   FOR VALUABLES.   Contacts, dentures or bridgework may not be worn into surgery.   Bring small overnight bag day of surgery   Please read over the following fact sheets you were given: IF YOU HAVE QUESTIONS ABOUT YOUR PRE OP INSTRUCTIONS PLEASE CALL [phone]    - Preparing for Surgery Before surgery, you can play an important role.  Because skin is not sterile, your skin needs to be as free of germs as possible.  You can reduce the number of germs on your skin by washing with CHG (chlorahexidine gluconate) soap before surgery.  CHG is an antiseptic cleaner which kills germs and bonds with the skin to continue killing germs even after washing. Please DO NOT use if you have an allergy to CHG or antibacterial soaps.  If your skin becomes reddened/irritated stop using the CHG and inform your nurse when you arrive at Short Stay. Do not shave (including legs and underarms) for at least 48 hours prior to the first CHG shower.  You may shave your face/neck.  Please follow these instructions carefully:  1.  Shower with CHG Soap the night before surgery and the  morning of surgery.  2.  If you choose to wash your hair, wash your hair first as usual with your normal  shampoo.  3.  After you shampoo, rinse your hair and body thoroughly to remove the shampoo.                             4.  Use CHG as you would any other liquid soap.  You can apply chg directly to the skin and wash.  Gently with a scrungie or clean washcloth.  5.  Apply the CHG Soap to your body ONLY FROM THE NECK DOWN.   Do   not use on face/ open                           Wound or open sores. Avoid contact with eyes, ears mouth and   genitals (private parts).                       Wash face,  Genitals (private parts) with your normal soap.             6.  Wash thoroughly, paying special attention to the area where your    surgery  will  be performed.  7.  Thoroughly rinse your body with warm water from the neck down.  8.  DO NOT shower/wash with your normal  soap after using and rinsing off the CHG Soap.                9.  Pat yourself dry with a clean towel.            10.  Wear clean pajamas.            11.  Place clean sheets on your bed the night of your first shower and do not  sleep with pets. Day of Surgery : Do not apply any lotions/deodorants the morning of surgery.  Please wear clean clothes to the hospital/surgery center.  FAILURE TO FOLLOW THESE INSTRUCTIONS MAY RESULT IN THE CANCELLATION OF YOUR SURGERY  PATIENT SIGNATURE_________________________________  NURSE SIGNATURE__________________________________  ________________________________________________________________________

## 2021-01-27 NOTE — Progress Notes (Addendum)
[facility] Surgery and requested orders from Dr. Daphine Deutscher via phone.

## 2021-01-27 NOTE — Progress Notes (Addendum)
COVID Vaccine Completed: yes x4 Date COVID Vaccine completed: 09/05/19, 10/03/19, 06/12/20, 12/30/20 Has received booster: yes x2 COVID vaccine manufacturer: Moderna     Date of COVID positive in last 90 days: N/A  PCP - Feliciana Rossetti, MD Cardiologist - Rajah Revankar  Chest x-ray - 04/24/20 Epic EKG - 04/24/20 Epic Stress Test - 06/08/20 Epic ECHO - 06/17/20 Epic Cardiac Cath - N/A Pacemaker/ICD device last checked: N/A Spinal Cord Stimulator: N/A  Cardiac clearance in noted dated 12/10/20 by Doreatha Lew in [place].  Sleep Study - N/A CPAP -   Pt reports a sore to tailbone that is currently open. She states that it will close up and then reopen again. Also has red rash to pannus area that is not relieved with cream or powder.  Fasting Blood Sugar - 200-400 at home Checks Blood Sugar 4 times a day  Blood Thinner Instructions: no instructions from provider. Instructed patient and family member to call PCP. Aspirin Instructions: ASA 81mg   Activity level: Pt states she can go up stairs if a rail is present and she takes her time. She denies any CP with activity and ADLs. Does endorse SOB after 15-20 mins of activity, such as cleaning the house or walking. Pt states she takes a 10 mins break and is able to continue with activity.    Anesthesia review: HTN, DM, DOE, cardiac murmur  Patient denies shortness of breath, fever, cough and chest pain at PAT appointment   Patient verbalized understanding of instructions that were given to them at the PAT appointment. Patient was also instructed that they will need to review over the PAT instructions again at home before surgery.

## 2021-01-28 ENCOUNTER — Encounter (HOSPITAL_COMMUNITY): Payer: Self-pay

## 2021-01-28 ENCOUNTER — Encounter (HOSPITAL_COMMUNITY)
Admission: RE | Admit: 2021-01-28 | Discharge: 2021-01-28 | Disposition: A | Discharge status: Home / Self Care | Payer: Medicare Other | Source: Ambulatory Visit | Attending: Surgery | Admitting: Surgery

## 2021-01-28 ENCOUNTER — Other Ambulatory Visit: Payer: Self-pay

## 2021-01-28 DIAGNOSIS — Z01812 Encounter for preprocedural laboratory examination: Secondary | ICD-10-CM | POA: Diagnosis present

## 2021-01-28 HISTORY — DX: Other complications of anesthesia, initial encounter: T88.59XA

## 2021-01-28 LAB — CBC
HCT: 39.9 % (ref 36.0–46.0)
Hemoglobin: 13.5 g/dL (ref 12.0–15.0)
MCH: 30.9 pg (ref 26.0–34.0)
MCHC: 33.8 g/dL (ref 30.0–36.0)
MCV: 91.3 fL (ref 80.0–100.0)
Platelets: 152 10*3/uL (ref 150–400)
RBC: 4.37 MIL/uL (ref 3.87–5.11)
RDW: 13.2 % (ref 11.5–15.5)
WBC: 6.6 10*3/uL (ref 4.0–10.5)
nRBC: 0 % (ref 0.0–0.2)

## 2021-01-28 LAB — BASIC METABOLIC PANEL
Anion gap: 6 (ref 5–15)
BUN: 21 mg/dL (ref 8–23)
CO2: 26 mmol/L (ref 22–32)
Calcium: 9.8 mg/dL (ref 8.9–10.3)
Chloride: 103 mmol/L (ref 98–111)
Creatinine, Ser: 0.92 mg/dL (ref 0.44–1.00)
GFR, Estimated: >60 mL/min (ref >60)
Glucose, Bld: 253 mg/dL — ABNORMAL HIGH (ref 70–99)
Potassium: 4.2 mmol/L (ref 3.5–5.1)
Sodium: 135 mmol/L (ref 135–145)

## 2021-01-28 LAB — GLUCOSE, CAPILLARY: Glucose-Capillary: 255 mg/dL — ABNORMAL HIGH (ref 70–99)

## 2021-01-29 LAB — HEMOGLOBIN A1C
Hgb A1c MFr Bld: 9.2 % — ABNORMAL HIGH (ref 4.8–5.6)
Mean Plasma Glucose: 217 mg/dL

## 2021-02-01 NOTE — Progress Notes (Signed)
Patients A1C 9.2 at PAT visit.

## 2021-02-03 ENCOUNTER — Encounter (HOSPITAL_COMMUNITY): Payer: Self-pay | Admitting: Emergency Medicine

## 2021-02-03 NOTE — Progress Notes (Signed)
Anesthesia Chart Review:   Case: [id number] Date/Time: 02/11/21 0715   Procedure: ABDOMINAL PANNICULECTOMY   Anesthesia type: General   Pre-op diagnosis: panniculitis   Location: [facility] / [facility]   Surgeons: Luretha Murphy, MD       DISCUSSION: Pt is 75 years old with hx HTN, DM, Parkinsonism, chronic bronchitis, CKD stage 3  At pre-surgical testing, pt reported to RN she has an open sore on her tailbone and a red rash to her pannus. I was not asked to see pt. I notified Toniann Fail in Dr. Ermalene Searing office about open wound and rash and also about uncontrolled BM with A1c of 9.2   VS: BP (!) 130/55   Pulse 85   Temp 36.9 C (Oral)   Resp 16   Ht 5' (1.524 m)   SpO2 94%   BMI 36.01 kg/m   PROVIDERS: - PCP is Gordan Payment., MD - Cardiologist is Belva Crome, MD who cleared pt for surgery at last office visit 12/10/20   LABS:  - glucose 253, HbA1c 9.2  (all labs ordered are listed, but only abnormal results are displayed)  Labs Reviewed  HEMOGLOBIN A1C - Abnormal; Notable for the following components:      Result Value   Hgb A1c MFr Bld 9.2 (*)    All other components within normal limits  BASIC METABOLIC PANEL - Abnormal; Notable for the following components:   Glucose, Bld 253 (*)    All other components within normal limits  GLUCOSE, CAPILLARY - Abnormal; Notable for the following components:   Glucose-Capillary 255 (*)    All other components within normal limits  CBC     IMAGES: 1 view CXR 04/24/20: No active disease   EKG 04/24/20: Sinus rhythm. Nonspecific T wave abnormality   CV: Echo 06/17/20:  1. Left ventricular ejection fraction, by estimation, is 60 to 65%. The left ventricle has normal function. The left ventricle has no regional wall motion abnormalities. There is mild left ventricular hypertrophy. Left ventricular diastolic parameters  are consistent with Grade II diastolic dysfunction (pseudonormalization).   2. Right ventricular systolic  function is normal. The right ventricular size is normal. There is normal pulmonary artery systolic pressure.   3. Left atrial size was mildly dilated.   4. The mitral valve is normal in structure. No evidence of mitral valve regurgitation. No evidence of mitral stenosis.   5. The aortic valve is normal in structure. There is mild calcification of the aortic valve. Aortic valve regurgitation is not visualized. No aortic stenosis is present.   6. The inferior vena cava is normal in size with greater than 50% respiratory variability, suggesting right atrial pressure of 3 mmHg.   Nuclear stress test 06/08/20:  The left ventricular ejection fraction is normal (55-65%). Nuclear stress EF: 64%. There was no ST segment deviation noted during stress. Defect 1: There is a small defect of mild severity present in the mid anteroseptal location. No evidence of ischemia or MI. Breast attenuation noted. Normal EF   Past Medical History:  Diagnosis Date   Abnormal gait 09/16/2015   Last Assessment & Plan:  She is better from this with her gait and encourage her to continue with this  Last Assessment & Plan:  Formatting of this note might be different from the original. She is better from this with her gait and encourage her to continue with this   Acquired hypothyroidism 09/16/2015   Last Assessment & Plan:  This is stable for  her and will see her level  Last Assessment & Plan:  Formatting of this note might be different from the original. This is stable for her and will see her level   Anxiety    Anxiety disorder 09/16/2015   Last Assessment & Plan:  Long discussion with her about her mood and would not change her med from this and she is depressed and anxious and crying which she refuses to have psych see her and her son is very supportive of her  Last Assessment & Plan:  Formatting of this note might be different from the original. Long discussion with her about her mood and would not change her med from  this and she   Arthritis 09/16/2015   Last Assessment & Plan:  Relevant Hx: Course: Daily Update: Today's Plan:she has had multiple orthopedic procedures done to her back and her knees with continued pain and disability for the same following with ortho still  Electronically signed by: Krystal ClarkMelissa Joyce Brown-Patram, NP 09/17/15 2158   BMI 34.0-34.9,adult 06/14/2020   Bradycardia 11/14/2018   [place]   Callus of foot 11/13/2016   Chronic bronchitis (HCC) 05/03/2018   No formal diagnosis of COPD  Formatting of this note might be different from the original. No formal diagnosis of COPD   Class 2 severe obesity due to excess calories with serious comorbidity and body mass index (BMI) of 36.0 to 36.9 in adult [facility](HCC) 09/16/2015   Complication of anesthesia    nausea post op   Contracture of both Achilles tendons 10/29/2017   Cough 02/11/2008   Qualifier: Diagnosis of  By: Maple HudsonYoung MD, Clinton D    Degeneration of lumbar intervertebral disc 10/12/2008   Qualifier: Diagnosis of  By: Maple HudsonYoung MD, Clinton D   Last Assessment & Plan:  Formatting of this note might be different from the original. This is as stable as she can be and she did have surgery still following with Dr. Noel Geroldohen for this   DEGENERATIVE DISC DISEASE 10/12/2008   Qualifier: Diagnosis of  By: Maple HudsonYoung MD, Clinton D    Depression    Diabetes mellitus due to underlying condition with unspecified complications (HCC) 12/25/2018   DIABETES, TYPE 2 02/16/2008   Qualifier: Diagnosis of  By: Maple HudsonYoung MD, Clinton D    Diabetic peripheral neuropathy (HCC) 09/27/2020   Dysphagia 11/14/2018   [place]   Esophageal reflux 02/11/2008   Centricity Description: GASTROESOPHAGEAL REFLUX DISEASE, CHRONIC Qualifier: Diagnosis of  By: Maple HudsonYoung MD, Rennis Chrislinton D  Centricity Description: Gean MaidensG E R D Qualifier: Diagnosis of  By: Maple HudsonYoung MD, Vindex.NunneryClinton D    Essential hypertension 09/16/2015   Last Assessment & Plan:  This is stable for her and will follow  Last Assessment & Plan:   Formatting of this note might be different from the original. This is stable for her and will follow   Essential tremor 09/16/2015   Last Assessment & Plan:  Relevant Hx: Course: Daily Update: Today's Plan:they think this is better though still present on exam today and on beta blocker and has seen neuro recently  Electronically signed by: Krystal ClarkMelissa Joyce Brown-Patram, NP 09/17/15 2156  Last Assessment & Plan:  Formatting of this note might be different from the original. Relevant Hx: Course: Daily Update: Today's Plan:they think   Fibromyalgia 09/16/2015   Last Assessment & Plan:  This is off and on for her but she feels since having her back surgery it is better for her  Last Assessment & Plan:  Relevant Hx: Course:  Daily Update: Today's Plan:tied to her severe DDD which she states she is planning to have back surgery for on 10/08/15  Electronically signed by: Krystal Clark, NP 09/17/15 2157  Last Assessment & Plan:  Formatting of this    Forgetfulness 12/01/2019   Gastroesophageal reflux disease without esophagitis 02/11/2008   Centricity Description: GASTROESOPHAGEAL REFLUX DISEASE, CHRONIC Qualifier: Diagnosis of  By: Maple Hudson MD, Rennis Chris  Centricity Description: Gean Maidens D Qualifier: Diagnosis of  By: Maple Hudson MD, Clinton D   Last Assessment & Plan:  Formatting of this note might be different from the original. This is stable for her overall   Hallux valgus 09/16/2015   Hemoptysis 10/12/2008   Qualifier: Diagnosis of  By: Maple Hudson MD, Clinton D    High risk medication use 09/16/2015   History of ischemic vertebrobasilar artery thalamic stroke 09/17/2015   Last Assessment & Plan:  Relevant Hx: Course: Daily Update: Today's Plan:seen on MRI brain 2017 started on statin advised her to follow up on her sugars as well  Electronically signed by: Krystal Clark, NP 09/17/15 2210  Last Assessment & Plan:  Formatting of this note might be different from the original. Relevant Hx: Course: Daily  Update: Today's Plan:seen on MRI brain 2017 started    History of ventral hernia repair 11/20/2019   Hyperlipidemia 09/16/2015   Last Assessment & Plan:  She is going to have lipids checked today  Last Assessment & Plan:  Formatting of this note might be different from the original. She is going to have lipids checked today   Hypertension    Hypothyroidism    Laceration of scalp 09/06/2016   Localized edema 03/18/2018   Formatting of this note might be different from the original. Secondary to amlodipine.   Malaise and fatigue 09/10/2019   Metatarsalgia of both feet 10/29/2017   Mixed stress and urge urinary incontinence 09/03/2017   Myalgia 09/16/2015   Nail dystrophy 09/27/2020   Neck mass 09/06/2016   Obesity 09/16/2015   Onychomycosis due to dermatophyte 11/13/2016   Osteoarthritis of left knee 09/10/2019   Pain in left knee 04/11/2018   Panniculitis 12/05/2019   Parkinsonism (HCC) 09/09/2015   Polyneuropathy 09/16/2015   Last Assessment & Plan:  She is having stablity of this she states  Last Assessment & Plan:  Formatting of this note might be different from the original. She is having stablity of this she states   Pre-ulcerative calluses 09/27/2020   Primary osteoarthritis of right shoulder 09/05/2018   Recurrent major depressive disorder (HCC) 09/16/2015   Last Assessment & Plan:  She is to continue with her current meds and discussed with her and her son she has to try and get out more and be active and not sit at home dwelling on things she cannot change with her health  Last Assessment & Plan:  Formatting of this note might be different from the original. She is to continue with her current meds and discussed with her and her son she has to try a   Recurrent UTI 01/14/2019   Restless leg syndrome 09/09/2015   RLS (restless legs syndrome) 09/09/2015   Last Assessment & Plan:  Formatting of this note might be different from the original. Relevant Hx: Course: Daily Update:  Today's Plan:she feels this is more stable for her today  Electronically signed by: Krystal Clark, NP 09/17/15 2204   S/P TKR (total knee replacement) using cement, right 06/16/2019   Stage 3a chronic kidney disease (HCC)  06/27/2018   Tremor 09/09/2015   Type 2 diabetes mellitus with stage 3 chronic kidney disease, with long-term current use of insulin (HCC) 09/17/2015   Last Assessment & Plan:  Formatting of this note might be different from the original. She has been all over the place and she admits she is not eating what she should and had a reading of 500 and low has not been more than 200 for her, and her sensation is better she had admitted she has to take this more seriously   Vitamin B12 deficiency 03/01/2017   Vitamin D deficiency 03/01/2017    Past Surgical History:  Procedure Laterality Date   ABDOMINAL HYSTERECTOMY     BREAST REDUCTION SURGERY     CERVICAL SPINE SURGERY     CHOLECYSTECTOMY     HAND SURGERY     KNEE SURGERY     Arthroscopic right   LUMBAR DISC SURGERY  2017   x 2   TOTAL KNEE ARTHROPLASTY Right 06/16/2019   Procedure: TOTAL KNEE ARTHROPLASTY;  Surgeon: Dannielle Huh, MD;  Location: [facility];  Service: Orthopedics;  Laterality: Right;  75 mins needed for length of case    MEDICATIONS:  acetaminophen (TYLENOL) 650 MG CR tablet   amLODipine (NORVASC) 10 MG tablet   aspirin EC 81 MG tablet   cholecalciferol (VITAMIN D3) 25 MCG (1000 UNIT) tablet   citalopram (CELEXA) 10 MG tablet   clonazePAM (KLONOPIN) 1 MG tablet   cyanocobalamin (,VITAMIN B-12,) 1000 MCG/ML injection   furosemide (LASIX) 20 MG tablet   gabapentin (NEURONTIN) 300 MG capsule   insulin NPH-regular Human (NOVOLIN 70/30) (70-30) 100 UNIT/ML injection   levothyroxine (SYNTHROID) 100 MCG tablet   losartan (COZAAR) 100 MG tablet   Menthol-Camphor (ICY HOT ARTHRITIS PAIN RELIEF EX)   metFORMIN (GLUCOPHAGE-XR) 500 MG 24 hr tablet   Misc Natural Products (GLUCOSAMINE CHOND MSM  FORMULA PO)   nitroGLYCERIN (NITROSTAT) 0.4 MG SL tablet   NYSTATIN powder   oxybutynin (DITROPAN) 5 MG tablet   pantoprazole (PROTONIX) 40 MG tablet   pravastatin (PRAVACHOL) 20 MG tablet   No current facility-administered medications for this encounter.    If glucose acceptable day of surgery, I anticipate pt can proceed with surgery as scheduled.  Rica Mast, PhD, FNP-BC [facility] Stay [facility]/Anesthesiology Phone: [phone] 02/03/2021 10:03 AM

## 2021-02-03 NOTE — Anesthesia Preprocedure Evaluation (Deleted)
Anesthesia Evaluation    Airway        Dental   Pulmonary Current Smoker and Patient abstained from smoking.,           Cardiovascular hypertension,      Neuro/Psych    GI/Hepatic   Endo/Other  diabetes  Renal/GU      Musculoskeletal   Abdominal   Peds  Hematology   Anesthesia Other Findings   Reproductive/Obstetrics                             Anesthesia Physical Anesthesia Plan  ASA:   Anesthesia Plan:    Post-op Pain Management:    Induction:   PONV Risk Score and Plan:   Airway Management Planned:   Additional Equipment:   Intra-op Plan:   Post-operative Plan:   Informed Consent:   Plan Discussed with:   Anesthesia Plan Comments: (See APP note by Joslyn Hy, FNP)        Anesthesia Quick Evaluation

## 2021-02-09 ENCOUNTER — Other Ambulatory Visit (HOSPITAL_COMMUNITY)
Admission: RE | Admit: 2021-02-09 | Discharge: 2021-02-09 | Disposition: A | Discharge status: Home / Self Care | Payer: Medicare Other | Source: Ambulatory Visit | Attending: Surgery | Admitting: Surgery

## 2021-02-09 DIAGNOSIS — Z20822 Contact with and (suspected) exposure to covid-19: Secondary | ICD-10-CM | POA: Insufficient documentation

## 2021-02-09 DIAGNOSIS — Z01812 Encounter for preprocedural laboratory examination: Secondary | ICD-10-CM | POA: Insufficient documentation

## 2021-02-09 LAB — SARS CORONAVIRUS 2 (TAT 6-24 HRS): SARS Coronavirus 2: NEGATIVE

## 2021-02-11 ENCOUNTER — Ambulatory Visit (HOSPITAL_COMMUNITY): Admission: RE | Admit: 2021-02-11 | Payer: Medicare Other | Source: Ambulatory Visit | Admitting: Surgery

## 2021-02-11 ENCOUNTER — Encounter (HOSPITAL_COMMUNITY): Admission: RE | Payer: Self-pay | Source: Ambulatory Visit

## 2021-02-11 SURGERY — PANNICULECTOMY
Anesthesia: General

## 2021-05-20 DIAGNOSIS — L900 Lichen sclerosus et atrophicus: Secondary | ICD-10-CM

## 2021-05-20 HISTORY — DX: Lichen sclerosus et atrophicus: L90.0

## 2021-09-20 DIAGNOSIS — N281 Cyst of kidney, acquired: Secondary | ICD-10-CM

## 2021-09-20 DIAGNOSIS — K7460 Unspecified cirrhosis of liver: Secondary | ICD-10-CM | POA: Insufficient documentation

## 2021-09-20 DIAGNOSIS — K5790 Diverticulosis of intestine, part unspecified, without perforation or abscess without bleeding: Secondary | ICD-10-CM

## 2021-09-20 HISTORY — DX: Unspecified cirrhosis of liver: K74.60

## 2021-09-20 HISTORY — DX: Diverticulosis of intestine, part unspecified, without perforation or abscess without bleeding: K57.90

## 2021-09-20 HISTORY — DX: Cyst of kidney, acquired: N28.1

## 2021-09-23 NOTE — Progress Notes (Signed)
Sent message, via epic in basket, requesting orders in epic from surgeon.  

## 2021-10-03 ENCOUNTER — Encounter (HOSPITAL_COMMUNITY): Payer: Medicare Other

## 2021-10-14 ENCOUNTER — Ambulatory Visit: Admit: 2021-10-14 | Payer: Medicare Other | Admitting: Surgery

## 2021-10-14 SURGERY — PANNICULECTOMY
Anesthesia: General

## 2022-01-02 ENCOUNTER — Ambulatory Visit: Payer: Medicare Other | Admitting: Cardiology

## 2022-02-03 DIAGNOSIS — T8859XA Other complications of anesthesia, initial encounter: Secondary | ICD-10-CM | POA: Insufficient documentation

## 2022-02-07 ENCOUNTER — Ambulatory Visit: Payer: Medicare Other | Admitting: Cardiology

## 2022-02-07 ENCOUNTER — Encounter: Payer: Self-pay | Admitting: Cardiology

## 2022-02-07 ENCOUNTER — Telehealth: Payer: Self-pay | Admitting: Cardiology

## 2022-02-07 VITALS — BP 134/65 | HR 62 | Ht 61.0 in | Wt 173.0 lb

## 2022-02-07 DIAGNOSIS — E669 Obesity, unspecified: Secondary | ICD-10-CM

## 2022-02-07 DIAGNOSIS — E088 Diabetes mellitus due to underlying condition with unspecified complications: Secondary | ICD-10-CM | POA: Diagnosis not present

## 2022-02-07 DIAGNOSIS — E66811 Obesity, class 1: Secondary | ICD-10-CM

## 2022-02-07 DIAGNOSIS — E782 Mixed hyperlipidemia: Secondary | ICD-10-CM | POA: Diagnosis not present

## 2022-02-07 DIAGNOSIS — I10 Essential (primary) hypertension: Secondary | ICD-10-CM | POA: Diagnosis not present

## 2022-02-07 DIAGNOSIS — R058 Other specified cough: Secondary | ICD-10-CM

## 2022-02-07 HISTORY — DX: Obesity, class 1: E66.811

## 2022-02-07 HISTORY — DX: Obesity, unspecified: E66.9

## 2022-02-07 NOTE — Patient Instructions (Signed)
Medication Instructions:  Your physician recommends that you continue on your current medications as directed. Please refer to the Current Medication list given to you today.  *If you need a refill on your cardiac medications before your next appointment, please call your pharmacy*   Lab Work: Your physician recommends that you have labs done in the office today. Your test included  basic metabolic panel, complete blood count and TSH.  If you have labs (blood work) drawn today and your tests are completely normal, you will receive your results only by: MyChart Message (if you have MyChart) OR A paper copy in the mail If you have any lab test that is abnormal or we need to change your treatment, we will call you to review the results.   Testing/Procedures: A chest x-ray takes a picture of the organs and structures inside the chest, including the heart, lungs, and blood vessels. This test can show several things, including, whether the heart is enlarges; whether fluid is building up in the lungs; and whether pacemaker / defibrillator leads are still in place.    Follow-Up: At [facility], you and your health needs are our priority.  As part of our continuing mission to provide you with exceptional heart care, we have created designated Provider Care Teams.  These Care Teams include your primary Cardiologist (physician) and Advanced Practice Providers (APPs -  Physician Assistants and Nurse Practitioners) who all work together to provide you with the care you need, when you need it.  We recommend signing up for the patient portal called "MyChart".  Sign up information is provided on this After Visit Summary.  MyChart is used to connect with patients for Virtual Visits (Telemedicine).  Patients are able to view lab/test results, encounter notes, upcoming appointments, etc.  Non-urgent messages can be sent to your provider as well.   To learn more about what you can do with MyChart, go to  [email or web address].    Your next appointment:   9 month(s)  The format for your next appointment:   In Person  Provider:   Belva Crome, MD   Other Instructions NA

## 2022-02-07 NOTE — Telephone Encounter (Signed)
Advised that the radiologist has not read the Xray at this time.

## 2022-02-07 NOTE — Progress Notes (Signed)
Cardiology Office Note:    Date:  02/07/2022   ID:  Kayla Horne, DOB 09-26-1945, MRN [id number]  PCP:  Raina Mina., MD  Cardiologist:  Jenean Lindau, MD   Referring MD: Raina Mina., MD    ASSESSMENT:    1. Essential hypertension   2. Mixed hyperlipidemia   3. Diabetes mellitus due to underlying condition with unspecified complications ([place])   4. Obesity (BMI 30.0-34.9)    PLAN:    In order of problems listed above:  Primary stressed to the patient.  Importance of compliance with diet and medication stressed and she vocalized understanding. Cough with expectoration: I told her to see her primary care and follow-up.  She requests chest x-ray with blood work.  She tells me that if this is abnormal then she will see care for treatment.  I will oblige this 1 time. Essential hypertension: Blood pressure stable and diet was emphasized. Mixed dyslipidemia and diabetes mellitus: Diet emphasized.  Lifestyle modification urged weight reduction stressed and she promises to do better. I mentioned to her that with the current cough with expectoration that if she gets worse she needs to go to the nearest emergency room.  We will forward the reports of the aforementioned to her primary care. Patient will be seen in follow-up appointment in 9 months or earlier if the patient has any concerns    Medication Adjustments/Labs and Tests Ordered: Current medicines are reviewed at length with the patient today.  Concerns regarding medicines are outlined above.  No orders of the defined types were placed in this encounter.  No orders of the defined types were placed in this encounter.    No chief complaint on file.    History of Present Illness:    Kayla Horne is a 76 y.o. female.  Patient has past medical history of essential hypertension, dyslipidemia, diabetes mellitus and obesity.  She is here for routine follow-up.  She mentions to me that she has cough with some  phlegm.  No fever no chest pain orthopnea or PND.  She tells me that she is feeling worse in the past couple of days.  At the time of my evaluation, the patient is alert awake oriented and in no distress.  Past Medical History:  Diagnosis Date   Abnormal gait 09/16/2015   Last Assessment & Plan:  She is better from this with her gait and encourage her to continue with this  Last Assessment & Plan:  Formatting of this note might be different from the original. She is better from this with her gait and encourage her to continue with this   Acquired hypothyroidism 09/16/2015   Last Assessment & Plan:  This is stable for her and will see her level  Last Assessment & Plan:  Formatting of this note might be different from the original. This is stable for her and will see her level   Anxiety    Anxiety disorder 09/16/2015   Last Assessment & Plan:  Long discussion with her about her mood and would not change her med from this and she is depressed and anxious and crying which she refuses to have psych see her and her son is very supportive of her  Last Assessment & Plan:  Formatting of this note might be different from the original. Long discussion with her about her mood and would not change her med from this and she   Arthritis 09/16/2015   Last Assessment & Plan:  Relevant  Hx: Course: Daily Update: Today's Plan:she has had multiple orthopedic procedures done to her back and her knees with continued pain and disability for the same following with ortho still  Electronically signed by: Mayer Camel, NP 09/17/15 2158   BMI 34.0-34.9,adult 06/14/2020   Bradycardia 11/14/2018   [place]   Callus of foot 11/13/2016   Chronic bronchitis ([place]) 05/03/2018   No formal diagnosis of COPD  Formatting of this note might be different from the original. No formal diagnosis of COPD   Class 2 severe obesity due to excess calories with serious comorbidity and body mass index (BMI) of 36.0 to 36.9 in adult  [facility]) [id number]   Complication of anesthesia    nausea post op   Contracture of both Achilles tendons 10/29/2017   Cough 02/11/2008   Qualifier: Diagnosis of  By: Annamaria Boots MD, Clinton D    Degeneration of lumbar intervertebral disc 10/12/2008   Qualifier: Diagnosis of  By: Annamaria Boots MD, Clinton D   Last Assessment & Plan:  Formatting of this note might be different from the original. This is as stable as she can be and she did have surgery still following with Dr. Patrice Paradise for this   Depression    Diabetes mellitus due to underlying condition with unspecified complications ([place]) [id number]   DIABETES, TYPE 2 02/16/2008   Qualifier: Diagnosis of  By: Annamaria Boots MD, Clinton D    Diabetic peripheral neuropathy ([place]) 09/27/2020   Diverticulosis 09/20/2021   Dysphagia 11/14/2018   [place]   Esophageal reflux 02/11/2008   Centricity Description: GASTROESOPHAGEAL REFLUX DISEASE, CHRONIC Qualifier: Diagnosis of  By: Annamaria Boots MD, Kasandra Knudsen  Centricity Description: Marcy Salvo D Qualifier: Diagnosis of  By: Annamaria Boots MD, Odell.Proctor D    Essential hypertension 09/16/2015   Last Assessment & Plan:  This is stable for her and will follow  Last Assessment & Plan:  Formatting of this note might be different from the original. This is stable for her and will follow   Essential tremor 09/16/2015   Last Assessment & Plan:  Relevant Hx: Course: Daily Update: Today's Plan:they think this is better though still present on exam today and on beta blocker and has seen neuro recently  Electronically signed by: Mayer Camel, NP 09/17/15 2156  Last Assessment & Plan:  Formatting of this note might be different from the original. Relevant Hx: Course: Daily Update: Today's Plan:they think   Fibromyalgia 09/16/2015   Last Assessment & Plan:  This is off and on for her but she feels since having her back surgery it is better for her  Last Assessment & Plan:  Relevant Hx: Course: Daily Update: Today's Plan:tied to her severe DDD which she  states she is planning to have back surgery for on 10/08/15  Electronically signed by: Mayer Camel, NP 09/17/15 2157  Last Assessment & Plan:  Formatting of this    Forgetfulness 12/01/2019   Gastroesophageal reflux disease without esophagitis 02/11/2008   Centricity Description: GASTROESOPHAGEAL REFLUX DISEASE, CHRONIC Qualifier: Diagnosis of  By: Annamaria Boots MD, Kasandra Knudsen  Centricity Description: Marcy Salvo D Qualifier: Diagnosis of  By: Annamaria Boots MD, Clinton D   Last Assessment & Plan:  Formatting of this note might be different from the original. This is stable for her overall   Hallux valgus 09/16/2015   Hemoptysis 10/12/2008   Qualifier: Diagnosis of  By: Annamaria Boots MD, Clinton D    High risk medication use 09/16/2015   History of ischemic vertebrobasilar artery thalamic stroke  09/17/2015   Last Assessment & Plan:  Relevant Hx: Course: Daily Update: Today's Plan:seen on MRI brain 2017 started on statin advised her to follow up on her sugars as well  Electronically signed by: Mayer Camel, NP 09/17/15 2210  Last Assessment & Plan:  Formatting of this note might be different from the original. Relevant Hx: Course: Daily Update: Today's Plan:seen on MRI brain 2017 started    History of ventral hernia repair 11/20/2019   Hyperlipidemia 09/16/2015   Last Assessment & Plan:  She is going to have lipids checked today  Last Assessment & Plan:  Formatting of this note might be different from the original. She is going to have lipids checked today   Hypertension    Hypothyroidism    Laceration of scalp 0000000   Lichen sclerosus 0000000   Liver cirrhosis ([place]) 09/20/2021   Localized edema 03/18/2018   Formatting of this note might be different from the original. Secondary to amlodipine.   Malaise and fatigue 09/10/2019   Metatarsalgia of both feet 10/29/2017   Mixed stress and urge urinary incontinence 09/03/2017   Myalgia 09/16/2015   Nail dystrophy 09/27/2020   Neck mass  09/06/2016   Obesity 09/16/2015   Onychomycosis due to dermatophyte 11/13/2016   Osteoarthritis of left knee 09/10/2019   Pain in left knee 04/11/2018   Panniculitis 12/05/2019   Parkinsonism ([place]) 09/09/2015   Polyneuropathy 09/16/2015   Last Assessment & Plan:  She is having stablity of this she states  Last Assessment & Plan:  Formatting of this note might be different from the original. She is having stablity of this she states   Pre-ulcerative calluses 09/27/2020   Preoperative cardiovascular examination 12/10/2020   Primary osteoarthritis of right shoulder 09/05/2018   Recurrent major depressive disorder ([place]) 09/16/2015   Last Assessment & Plan:  She is to continue with her current meds and discussed with her and her son she has to try and get out more and be active and not sit at home dwelling on things she cannot change with her health  Last Assessment & Plan:  Formatting of this note might be different from the original. She is to continue with her current meds and discussed with her and her son she has to try a   Recurrent UTI 01/14/2019   Renal cyst 09/20/2021   Restless leg syndrome 09/09/2015   RLS (restless legs syndrome) 09/09/2015   Last Assessment & Plan:  Formatting of this note might be different from the original. Relevant Hx: Course: Daily Update: Today's Plan:she feels this is more stable for her today  Electronically signed by: Mayer Camel, NP 09/17/15 2204   S/P TKR (total knee replacement) using cement, right 06/16/2019   Stage 3a chronic kidney disease ([place]) 06/27/2018   Tremor 09/09/2015   Type 2 diabetes mellitus with stage 3 chronic kidney disease, with long-term current use of insulin ([place]) 09/17/2015   Last Assessment & Plan:  Formatting of this note might be different from the original. She has been all over the place and she admits she is not eating what she should and had a reading of 500 and low has not been more than 200 for her, and her  sensation is better she had admitted she has to take this more seriously   Vitamin B12 deficiency 03/01/2017   Vitamin D deficiency 03/01/2017    Past Surgical History:  Procedure Laterality Date   ABDOMINAL HYSTERECTOMY     BREAST REDUCTION SURGERY  CERVICAL SPINE SURGERY     CHOLECYSTECTOMY     HAND SURGERY     KNEE SURGERY     Arthroscopic right   LUMBAR DISC SURGERY  2017   x 2   TOTAL KNEE ARTHROPLASTY Right 06/16/2019   Procedure: TOTAL KNEE ARTHROPLASTY;  Surgeon: Dannielle Huh, MD;  Location: [facility];  Service: Orthopedics;  Laterality: Right;  75 mins needed for length of case    Current Medications: Current Meds  Medication Sig   acetaminophen (TYLENOL) 650 MG CR tablet Take 650 mg by mouth every 8 (eight) hours as needed for pain.   amLODipine (NORVASC) 10 MG tablet Take 5 mg by mouth every evening.   amoxicillin-clavulanate (AUGMENTIN) 875-125 MG tablet Take 1 tablet by mouth 2 (two) times daily.   aspirin EC 81 MG tablet Take 81 mg by mouth daily.   busPIRone (BUSPAR) 5 MG tablet Take 5 mg by mouth 2 (two) times daily.   cholecalciferol (VITAMIN D3) 25 MCG (1000 UNIT) tablet Take 1,000 Units by mouth daily.   citalopram (CELEXA) 10 MG tablet Take 5 mg by mouth daily.   clonazePAM (KLONOPIN) 1 MG tablet Take 1 mg by mouth every evening.   cyanocobalamin (,VITAMIN B-12,) 1000 MCG/ML injection Inject 1,000 mcg into the muscle every 30 (thirty) days.   furosemide (LASIX) 20 MG tablet Take 40 mg by mouth daily.   insulin NPH-regular Human (NOVOLIN 70/30) (70-30) 100 UNIT/ML injection Inject 35 Units into the skin 2 (two) times daily with a meal.    levothyroxine (SYNTHROID) 100 MCG tablet Take 100 mcg by mouth daily.    losartan (COZAAR) 100 MG tablet Take 100 mg by mouth daily.   Menthol-Camphor (ICY HOT ARTHRITIS PAIN RELIEF EX) Apply 1 application topically daily as needed (pain).   Misc Natural Products (GLUCOSAMINE CHOND MSM FORMULA PO) Take 1 tablet by mouth daily.    nitroGLYCERIN (NITROSTAT) 0.4 MG SL tablet Place 0.4 mg under the tongue every 5 (five) minutes as needed for chest pain.   NYSTATIN powder Apply 1 application topically 2 (two) times daily as needed for rash. Apply to tail bone.   ondansetron (ZOFRAN) 4 MG tablet Take 4 mg by mouth every 8 (eight) hours as needed for nausea/vomiting.   oxybutynin (DITROPAN) 5 MG tablet Take 5 mg by mouth 2 (two) times daily.   pantoprazole (PROTONIX) 40 MG tablet Take 40 mg by mouth daily.   pravastatin (PRAVACHOL) 20 MG tablet Take 20 mg by mouth daily.     Allergies:   Codeine sulfate, Morphine, Latex, and Tape   Social History   Socioeconomic History   Marital status: Single    Spouse name: Not on file   Number of children: 1   Years of education: 12   Highest education level: Not on file  Occupational History   Occupation: retired  Tobacco Use   Smoking status: Some Days    Packs/day: 0.50    Years: 20.00    Total pack years: 10.00    Types: Cigarettes   Smokeless tobacco: Never   Tobacco comments:    5 -7 cigarettes a week  Substance and Sexual Activity   Alcohol use: No   Drug use: No   Sexual activity: Not on file  Other Topics Concern   Not on file  Social History Narrative   Lives with two dogs and two cats.  One adopted son.        Patient does not drink caffeine.   Patient  is right handed.    Social Determinants of Health   Financial Resource Strain: Not on file  Food Insecurity: Not on file  Transportation Needs: Not on file  Physical Activity: Not on file  Stress: Not on file  Social Connections: Not on file     Family History: The patient's family history includes CAD in her father; CAD (age of onset: 84) in her brother; CAD (age of onset: 3) in her brother; Restless legs syndrome in her mother; Stroke (age of onset: 16) in her father. There is no history of Tremor.  ROS:   Please see the history of present illness.    All other systems reviewed and are  negative.  EKGs/Labs/Other Studies Reviewed:    The following studies were reviewed today: I discussed my findings with the patient at length   Recent Labs: No results found for requested labs within last 365 days.  Recent Lipid Panel No results found for: "CHOL", "TRIG", "HDL", "CHOLHDL", "VLDL", "LDLCALC", "LDLDIRECT"  Physical Exam:    VS:  BP 134/65   Pulse 62   Ht 5\' 1"  (1.549 m)   Wt 173 lb (78.5 kg)   SpO2 95%   BMI 32.69 kg/m     Wt Readings from Last 3 Encounters:  02/07/22 173 lb (78.5 kg)  12/10/20 184 lb 6.4 oz (83.6 kg)  06/08/20 186 lb (84.4 kg)     GEN: Patient is in no acute distress HEENT: Normal NECK: No JVD; No carotid bruits LYMPHATICS: No lymphadenopathy CARDIAC: Hear sounds regular, 2/6 systolic murmur at the apex. RESPIRATORY:  Clear to auscultation without rales, wheezing or rhonchi  ABDOMEN: Soft, non-tender, non-distended MUSCULOSKELETAL:  No edema; No deformity  SKIN: Warm and dry NEUROLOGIC:  Alert and oriented x 3 PSYCHIATRIC:  Normal affect   Signed, 13/09/21, MD  02/07/2022 2:19 PM    [facility]

## 2022-02-07 NOTE — Telephone Encounter (Signed)
Patient called to get results of testing.

## 2022-02-08 LAB — CBC
Hematocrit: 47.6 % — ABNORMAL HIGH (ref 34.0–46.6)
Hemoglobin: 15.8 g/dL (ref 11.1–15.9)
MCH: 29.5 pg (ref 26.6–33.0)
MCHC: 33.2 g/dL (ref 31.5–35.7)
MCV: 89 fL (ref 79–97)
Platelets: 205 10*3/uL (ref 150–450)
RBC: 5.35 x10E6/uL — ABNORMAL HIGH (ref 3.77–5.28)
RDW: 13.1 % (ref 11.7–15.4)
WBC: 13.1 10*3/uL — ABNORMAL HIGH (ref 3.4–10.8)

## 2022-02-08 LAB — BASIC METABOLIC PANEL
BUN/Creatinine Ratio: 24 (ref 12–28)
BUN: 28 mg/dL — ABNORMAL HIGH (ref 8–27)
CO2: 23 mmol/L (ref 20–29)
Calcium: 10.2 mg/dL (ref 8.7–10.3)
Chloride: 100 mmol/L (ref 96–106)
Creatinine, Ser: 1.15 mg/dL — ABNORMAL HIGH (ref 0.57–1.00)
Glucose: 161 mg/dL — ABNORMAL HIGH (ref 70–99)
Potassium: 4.7 mmol/L (ref 3.5–5.2)
Sodium: 139 mmol/L (ref 134–144)
eGFR: 49 mL/min/{1.73_m2} — ABNORMAL LOW (ref >59)

## 2022-02-08 LAB — TSH: TSH: 2.96 u[IU]/mL (ref 0.450–4.500)

## 2022-02-08 NOTE — Telephone Encounter (Signed)
Left a VN for pt's son to callback.

## 2022-02-08 NOTE — Telephone Encounter (Signed)
Patient called again to get results of x-ray. Patient is getting anxious that something is wrong. Pleae call s soon as Dr. Tomie China reads the results

## 2022-02-08 NOTE — Telephone Encounter (Signed)
Advised that we do not have a report at this time.

## 2022-02-09 DIAGNOSIS — F17210 Nicotine dependence, cigarettes, uncomplicated: Secondary | ICD-10-CM | POA: Insufficient documentation

## 2022-02-09 HISTORY — DX: Nicotine dependence, cigarettes, uncomplicated: F17.210

## 2022-04-20 DIAGNOSIS — C641 Malignant neoplasm of right kidney, except renal pelvis: Secondary | ICD-10-CM

## 2022-04-20 HISTORY — DX: Malignant neoplasm of right kidney, except renal pelvis: C64.1

## 2022-10-04 DIAGNOSIS — G8929 Other chronic pain: Secondary | ICD-10-CM

## 2022-10-04 HISTORY — DX: Other chronic pain: G89.29

## 2022-10-31 ENCOUNTER — Telehealth: Payer: Self-pay | Admitting: Cardiology

## 2022-10-31 NOTE — Telephone Encounter (Signed)
Spoke with pt regarding BP and pulse. She stated that her pulse was 48-58 and her BP was 126/50. She stated that she wanted to see Dr. Geraldo Pitter today. Advised that Dr. Geraldo Pitter was not in the office today and that if she needed to be seen today she could go to urgent care, her PCP or the ED. She stated that she would like to see Dr. Geraldo Pitter sooner. Will send message to front desk to move appt up per Pt request.

## 2022-10-31 NOTE — Telephone Encounter (Signed)
STAT if HR is under 50 or over 120 (normal HR is 60-100 beats per minute)  What is your heart rate? 59, 53  Do you have a log of your heart rate readings (document readings)? Yes   Do you have any other symptoms? BP is low; dizziness; lightheaded: 126/50; 53 (BP)

## 2022-11-01 ENCOUNTER — Ambulatory Visit: Payer: Medicare Other | Attending: Cardiology

## 2022-11-01 ENCOUNTER — Other Ambulatory Visit: Payer: Self-pay

## 2022-11-01 ENCOUNTER — Ambulatory Visit: Payer: Medicare Other | Attending: Cardiology | Admitting: Cardiology

## 2022-11-01 VITALS — BP 122/60 | HR 66 | Ht 60.0 in | Wt 176.0 lb

## 2022-11-01 DIAGNOSIS — N183 Chronic kidney disease, stage 3 unspecified: Secondary | ICD-10-CM

## 2022-11-01 DIAGNOSIS — Z6836 Body mass index (BMI) 36.0-36.9, adult: Secondary | ICD-10-CM

## 2022-11-01 DIAGNOSIS — I10 Essential (primary) hypertension: Secondary | ICD-10-CM | POA: Diagnosis not present

## 2022-11-01 DIAGNOSIS — Z8673 Personal history of transient ischemic attack (TIA), and cerebral infarction without residual deficits: Secondary | ICD-10-CM

## 2022-11-01 DIAGNOSIS — E782 Mixed hyperlipidemia: Secondary | ICD-10-CM

## 2022-11-01 DIAGNOSIS — E1122 Type 2 diabetes mellitus with diabetic chronic kidney disease: Secondary | ICD-10-CM

## 2022-11-01 DIAGNOSIS — R002 Palpitations: Secondary | ICD-10-CM

## 2022-11-01 DIAGNOSIS — Z794 Long term (current) use of insulin: Secondary | ICD-10-CM

## 2022-11-01 DIAGNOSIS — R011 Cardiac murmur, unspecified: Secondary | ICD-10-CM

## 2022-11-01 HISTORY — DX: Palpitations: R00.2

## 2022-11-01 NOTE — Progress Notes (Signed)
Cardiology Office Note:    Date:  11/01/2022   ID:  Kayla Horne, DOB [birth year], MRN [id number]  PCP:  Raina Mina., MD  Cardiologist:  Jenean Lindau, MD   Referring MD: Raina Mina., MD    ASSESSMENT:    1. Essential hypertension   2. Type 2 diabetes mellitus with stage 3 chronic kidney disease, with long-term current use of insulin, unspecified whether stage 3a or 3b CKD   3. Class 2 severe obesity due to excess calories with serious comorbidity and body mass index (BMI) of 36.0 to 36.9 in adult   4. Mixed hyperlipidemia   5. History of ischemic vertebrobasilar artery thalamic stroke   6. Palpitations   7. Murmur, cardiac    PLAN:    In order of problems listed above:  Primary prevention stressed with the patient.  Importance of compliance with diet medication stressed and she vocalized understanding. Dizziness and occasional palpitations: The symptoms are concerning.  I will do blood work today including CBC and TSH.  I will do a 2-week monitor. Cardiac murmur: Echocardiogram will be done to assess murmur heard on auscultation. Mixed dyslipidemia, diabetes mellitus and morbid obesity: Weight reduction stressed diet emphasized and she promises to do better. She knows to go to the nearest emergency room for any concerning symptoms.Patient will be seen in follow-up appointment in 6 months or earlier if the patient has any concerns.    Medication Adjustments/Labs and Tests Ordered: Current medicines are reviewed at length with the patient today.  Concerns regarding medicines are outlined above.  No orders of the defined types were placed in this encounter.  No orders of the defined types were placed in this encounter.    No chief complaint on file.    History of Present Illness:    Kayla Horne is a 77 y.o. female.  Patient has a past medical history of essential hypertension, mixed dyslipidemia, diabetes mellitus and obesity.  She leads a sedentary  lifestyle.  She mentions to me that her heart rate runs low at times and she feels a little dizzy.  Her heart rate is in the high 30s and low 40s.  No chest pain orthopnea or PND.  At the time of my evaluation, the patient is alert awake oriented and in no distress.  Past Medical History:  Diagnosis Date   Abnormal gait 09/16/2015   Last Assessment & Plan:  She is better from this with her gait and encourage her to continue with this  Last Assessment & Plan:  Formatting of this note might be different from the original. She is better from this with her gait and encourage her to continue with this   Acquired hypothyroidism 09/16/2015   Last Assessment & Plan:  This is stable for her and will see her level  Last Assessment & Plan:  Formatting of this note might be different from the original. This is stable for her and will see her level   Anxiety    Anxiety disorder 09/16/2015   Last Assessment & Plan:  Long discussion with her about her mood and would not change her med from this and she is depressed and anxious and crying which she refuses to have psych see her and her son is very supportive of her  Last Assessment & Plan:  Formatting of this note might be different from the original. Long discussion with her about her mood and would not change her med from this and she  Arthritis 09/16/2015   Last Assessment & Plan:  Relevant Hx: Course: Daily Update: Today's Plan:she has had multiple orthopedic procedures done to her back and her knees with continued pain and disability for the same following with ortho still  Electronically signed by: Mayer Camel, NP 09/17/15 2158   BMI 34.0-34.9,adult 06/14/2020   Bradycardia 11/14/2018   [place]   Callus of foot 11/13/2016   Chronic bronchitis 05/03/2018   No formal diagnosis of COPD  Formatting of this note might be different from the original. No formal diagnosis of COPD   Chronic toe pain, bilateral 10/04/2022   Cigarette smoker  02/09/2022   Class 2 severe obesity due to excess calories with serious comorbidity and body mass index (BMI) of 36.0 to 36.9 in adult [id number]   Complication of anesthesia    nausea post op   Contracture of both Achilles tendons 10/29/2017   Cough 02/11/2008   Qualifier: Diagnosis of  By: Annamaria Boots MD, Clinton D    Degeneration of lumbar intervertebral disc 10/12/2008   Qualifier: Diagnosis of  By: Annamaria Boots MD, Clinton D   Last Assessment & Plan:  Formatting of this note might be different from the original. This is as stable as she can be and she did have surgery still following with Dr. Patrice Paradise for this   Depression    Diabetes mellitus due to underlying condition with unspecified complications [id number]   DIABETES, TYPE 2 02/16/2008   Qualifier: Diagnosis of  By: Annamaria Boots MD, Clinton D    Diabetic peripheral neuropathy 09/27/2020   Diverticulosis 09/20/2021   Dysphagia 11/14/2018   [place]   Esophageal reflux 02/11/2008   Centricity Description: GASTROESOPHAGEAL REFLUX DISEASE, CHRONIC Qualifier: Diagnosis of  By: Annamaria Boots MD, Kasandra Knudsen  Centricity Description: Marcy Salvo D Qualifier: Diagnosis of  By: Annamaria Boots MD, Odell.Proctor D    Essential hypertension 09/16/2015   Last Assessment & Plan:  This is stable for her and will follow  Last Assessment & Plan:  Formatting of this note might be different from the original. This is stable for her and will follow   Essential tremor 09/16/2015   Last Assessment & Plan:  Relevant Hx: Course: Daily Update: Today's Plan:they think this is better though still present on exam today and on beta blocker and has seen neuro recently  Electronically signed by: Mayer Camel, NP 09/17/15 2156  Last Assessment & Plan:  Formatting of this note might be different from the original. Relevant Hx: Course: Daily Update: Today's Plan:they think   Fibromyalgia 09/16/2015   Last Assessment & Plan:  This is off and on for her but she feels since having her back surgery it is better  for her  Last Assessment & Plan:  Relevant Hx: Course: Daily Update: Today's Plan:tied to her severe DDD which she states she is planning to have back surgery for on 10/08/15  Electronically signed by: Mayer Camel, NP 09/17/15 2157  Last Assessment & Plan:  Formatting of this    Forgetfulness 12/01/2019   Gastroesophageal reflux disease without esophagitis 02/11/2008   Centricity Description: GASTROESOPHAGEAL REFLUX DISEASE, CHRONIC Qualifier: Diagnosis of  By: Annamaria Boots MD, Kasandra Knudsen  Centricity Description: Marcy Salvo D Qualifier: Diagnosis of  By: Annamaria Boots MD, Clinton D   Last Assessment & Plan:  Formatting of this note might be different from the original. This is stable for her overall   Hallux valgus 09/16/2015   HEMOPTYSIS 10/12/2008   Qualifier: Diagnosis of   By: Annamaria Boots MD,  Clinton D     Replacing diagnoses that were inactivated after the 10/30/22 regulatory import   High risk medication use 09/16/2015   History of ischemic vertebrobasilar artery thalamic stroke 09/17/2015   Last Assessment & Plan:  Relevant Hx: Course: Daily Update: Today's Plan:seen on MRI brain 2017 started on statin advised her to follow up on her sugars as well  Electronically signed by: Mayer Camel, NP 09/17/15 2210  Last Assessment & Plan:  Formatting of this note might be different from the original. Relevant Hx: Course: Daily Update: Today's Plan:seen on MRI brain 2017 started    History of ventral hernia repair 11/20/2019   Hyperlipidemia 09/16/2015   Last Assessment & Plan:  She is going to have lipids checked today  Last Assessment & Plan:  Formatting of this note might be different from the original. She is going to have lipids checked today   Hypertension    Hypothyroidism    Laceration of scalp 0000000   Lichen sclerosus 0000000   Liver cirrhosis 09/20/2021   Localized edema 03/18/2018   Formatting of this note might be different from the original. Secondary to amlodipine.   Malaise and  fatigue 09/10/2019   Metatarsalgia of both feet 10/29/2017   Mixed stress and urge urinary incontinence 09/03/2017   Myalgia 09/16/2015   Nail dystrophy 09/27/2020   Neck mass 09/06/2016   Obesity 09/16/2015   Obesity (BMI 30.0-34.9) 02/07/2022   Onychomycosis due to dermatophyte 11/13/2016   Osteoarthritis of left knee 09/10/2019   Pain in left knee 04/11/2018   Panniculitis 12/05/2019   Parkinsonism 09/09/2015   Polyneuropathy 09/16/2015   Last Assessment & Plan:  She is having stablity of this she states  Last Assessment & Plan:  Formatting of this note might be different from the original. She is having stablity of this she states   Pre-ulcerative calluses 09/27/2020   Preoperative cardiovascular examination 12/10/2020   Primary osteoarthritis of right shoulder 09/05/2018   Recurrent major depressive disorder 09/16/2015   Last Assessment & Plan:  She is to continue with her current meds and discussed with her and her son she has to try and get out more and be active and not sit at home dwelling on things she cannot change with her health  Last Assessment & Plan:  Formatting of this note might be different from the original. She is to continue with her current meds and discussed with her and her son she has to try a   Recurrent UTI 01/14/2019   Renal cell carcinoma of right kidney 04/20/2022   Formatting of this note might be different from the original. 1.4 cm on CT [facility] 03/2022.  Tells me she saw urology and observing.  Keep f/u with them. Formatting of this note might be different from the original. 1.4 cm on CT [facility] 03/2022.  Tells me she saw urology and observing.  Keep f/u with them.   Renal cyst 09/20/2021   Restless leg syndrome 09/09/2015   RLS (restless legs syndrome) 09/09/2015   Last Assessment & Plan:  Formatting of this note might be different from the original. Relevant Hx: Course: Daily Update: Today's Plan:she feels this is more stable for her today  Electronically signed by:  Mayer Camel, NP 09/17/15 2204   S/P TKR (total knee replacement) using cement, right 06/16/2019   Stage 3a chronic kidney disease 06/27/2018   Tremor 09/09/2015   Type 2 diabetes mellitus with stage 3 chronic kidney disease, with long-term current use  of insulin 09/17/2015   Last Assessment & Plan:  Formatting of this note might be different from the original. She has been all over the place and she admits she is not eating what she should and had a reading of 500 and low has not been more than 200 for her, and her sensation is better she had admitted she has to take this more seriously   Vitamin B12 deficiency 03/01/2017   Vitamin D deficiency 03/01/2017    Past Surgical History:  Procedure Laterality Date   ABDOMINAL HYSTERECTOMY     BREAST REDUCTION SURGERY     CERVICAL SPINE SURGERY     CHOLECYSTECTOMY     HAND SURGERY     KNEE SURGERY     Arthroscopic right   LUMBAR [place] SURGERY  2017   x 2   TOTAL KNEE ARTHROPLASTY Right 06/16/2019   Procedure: TOTAL KNEE ARTHROPLASTY;  Surgeon: Vickey Huger, MD;  Location: [facility];  Service: Orthopedics;  Laterality: Right;  75 mins needed for length of case    Current Medications: Current Meds  Medication Sig   acetaminophen (TYLENOL) 650 MG CR tablet Take 650 mg by mouth every 8 (eight) hours as needed for pain.   amLODipine (NORVASC) 10 MG tablet Take 5 mg by mouth every evening.   BREO ELLIPTA 100-25 MCG/ACT AEPB Inhale 1 puff into the lungs daily.   busPIRone (BUSPAR) 5 MG tablet Take 5 mg by mouth 2 (two) times daily.   citalopram (CELEXA) 20 MG tablet Take 20 mg by mouth daily.   clonazePAM (KLONOPIN) 1 MG tablet Take 1 mg by mouth every evening.   cyanocobalamin (,VITAMIN B-12,) 1000 MCG/ML injection Inject 1,000 mcg into the muscle every 30 (thirty) days.   furosemide (LASIX) 20 MG tablet Take 40 mg by mouth daily.   gabapentin (NEURONTIN) 100 MG capsule Take 100 mg by mouth at bedtime.   insulin NPH-regular Human  (NOVOLIN 70/30) (70-30) 100 UNIT/ML injection Inject 35 Units into the skin 2 (two) times daily with a meal.    levothyroxine (SYNTHROID) 100 MCG tablet Take 100 mcg by mouth daily.    losartan (COZAAR) 100 MG tablet Take 100 mg by mouth daily.   Menthol-Camphor (ICY HOT ARTHRITIS PAIN RELIEF EX) Apply 1 application topically daily as needed (pain).   Misc Natural Products (GLUCOSAMINE CHOND MSM FORMULA PO) Take 1 tablet by mouth daily.   nitroGLYCERIN (NITROSTAT) 0.4 MG SL tablet Place 0.4 mg under the tongue every 5 (five) minutes as needed for chest pain.   ondansetron (ZOFRAN) 4 MG tablet Take 4 mg by mouth every 8 (eight) hours as needed for nausea/vomiting.   oxybutynin (DITROPAN) 5 MG tablet Take 5 mg by mouth 2 (two) times daily.   pantoprazole (PROTONIX) 40 MG tablet Take 40 mg by mouth daily.   pravastatin (PRAVACHOL) 20 MG tablet Take 20 mg by mouth daily.     Allergies:   Codeine sulfate, Morphine, Latex, and Tape   Social History   Socioeconomic History   Marital status: Single    Spouse name: Not on file   Number of children: 1   Years of education: 12   Highest education level: Not on file  Occupational History   Occupation: retired  Tobacco Use   Smoking status: Some Days    Packs/day: 0.50    Years: 20.00    Additional pack years: 0.00    Total pack years: 10.00    Types: Cigarettes   Smokeless tobacco: Never  Tobacco comments:    5 -7 cigarettes a week  Substance and Sexual Activity   Alcohol use: No   Drug use: No   Sexual activity: Not on file  Other Topics Concern   Not on file  Social History Narrative   Lives with two dogs and two cats.  One adopted son.        Patient does not drink caffeine.   Patient is right handed.    Social Determinants of Health   Financial Resource Strain: Not on file  Food Insecurity: Not on file  Transportation Needs: Not on file  Physical Activity: Not on file  Stress: Not on file  Social Connections: Not on file      Family History: The patient's family history includes CAD in her father; CAD (age of onset: 18) in her brother; CAD (age of onset: 57) in her brother; Restless legs syndrome in her mother; Stroke (age of onset: 58) in her father. There is no history of Tremor.  ROS:   Please see the history of present illness.    All other systems reviewed and are negative.  EKGs/Labs/Other Studies Reviewed:    The following studies were reviewed today: I discussed my findings with the patient at length.   Recent Labs: 02/07/2022: BUN 28; Creatinine, Ser 1.15; Hemoglobin 15.8; Platelets 205; Potassium 4.7; Sodium 139; TSH 2.960  Recent Lipid Panel No results found for: "CHOL", "TRIG", "HDL", "CHOLHDL", "VLDL", "LDLCALC", "LDLDIRECT"  Physical Exam:    VS:  BP 122/60   Pulse 66   Ht 5' (1.524 m)   Wt 176 lb (79.8 kg)   SpO2 92%   BMI 34.37 kg/m     Wt Readings from Last 3 Encounters:  11/01/22 176 lb (79.8 kg)  02/07/22 173 lb (78.5 kg)  12/10/20 184 lb 6.4 oz (83.6 kg)     GEN: Patient is in no acute distress HEENT: Normal NECK: No JVD; No carotid bruits LYMPHATICS: No lymphadenopathy CARDIAC: Hear sounds regular, 2/6 systolic murmur at the apex. RESPIRATORY:  Clear to auscultation without rales, wheezing or rhonchi  ABDOMEN: Soft, non-tender, non-distended MUSCULOSKELETAL:  No edema; No deformity  SKIN: Warm and dry NEUROLOGIC:  Alert and oriented x 3 PSYCHIATRIC:  Normal affect   Signed, Jenean Lindau, MD  11/01/2022 4:30 PM    [facility]

## 2022-11-01 NOTE — Patient Instructions (Addendum)
Medication Instructions:  Your physician recommends that you continue on your current medications as directed. Please refer to the Current Medication list given to you today.  *If you need a refill on your cardiac medications before your next appointment, please call your pharmacy*   Lab Work: Your physician recommends that you have a BMP, CBC and TSH today.  If you have labs (blood work) drawn today and your tests are completely normal, you will receive your results only by: [place] (if you have MyChart) OR A paper copy in the mail If you have any lab test that is abnormal or we need to change your treatment, we will call you to review the results.   Testing/Procedures: Your physician has requested that you have an echocardiogram. Echocardiography is a painless test that uses sound waves to create images of your heart. It provides your doctor with information about the size and shape of your heart and how well your heart's chambers and valves are working. This procedure takes approximately one hour. There are no restrictions for this procedure. Please do NOT wear cologne, perfume, aftershave, or lotions (deodorant is allowed). Please arrive 15 minutes prior to your appointment time.  A zio monitor was ordered today. It will remain on for 14 days. You will then return monitor and event diary in provided box. It takes 1-2 weeks for report to be downloaded and returned to [place]. We will call you with the results. If monitor falls off or has orange flashing light, please call Zio for further instructions. Wear the monitor for 14 days, remove 11/15/22.  Follow-Up: At [facility], you and your health needs are our priority.  As part of our continuing mission to provide you with exceptional heart care, we have created designated Provider Care Teams.  These Care Teams include your primary Cardiologist (physician) and Advanced Practice Providers (APPs -  Physician Assistants and Nurse  Practitioners) who all work together to provide you with the care you need, when you need it.  We recommend signing up for the patient portal called "MyChart".  Sign up information is provided on this After Visit Summary.  MyChart is used to connect with patients for Virtual Visits (Telemedicine).  Patients are able to view lab/test results, encounter notes, upcoming appointments, etc.  Non-urgent messages can be sent to your provider as well.   To learn more about what you can do with MyChart, go to [email or web address].    Your next appointment:   9 month(s)  The format for your next appointment:   In Person  Provider:   Jyl Heinz, MD   Other Instructions Echocardiogram An echocardiogram is a test that uses sound waves (ultrasound) to produce images of the heart. Images from an echocardiogram can provide important information about: Heart size and shape. The size and thickness and movement of your heart's walls. Heart muscle function and strength. Heart valve function or if you have stenosis. Stenosis is when the heart valves are too narrow. If blood is flowing backward through the heart valves (regurgitation). A tumor or infectious growth around the heart valves. Areas of heart muscle that are not working well because of poor blood flow or injury from a heart attack. Aneurysm detection. An aneurysm is a weak or damaged part of an artery wall. The wall bulges out from the normal force of blood pumping through the body. Tell a health care provider about: Any allergies you have. All medicines you are taking, including vitamins, herbs, eye drops, creams, and  over-the-counter medicines. Any blood disorders you have. Any surgeries you have had. Any medical conditions you have. Whether you are pregnant or may be pregnant. What are the risks? Generally, this is a safe test. However, problems may occur, including an allergic reaction to dye (contrast) that may be used during the  test. What happens before the test? No specific preparation is needed. You may eat and drink normally. What happens during the test? You will take off your clothes from the waist up and put on a hospital gown. Electrodes or electrocardiogram (ECG)patches may be placed on your chest. The electrodes or patches are then connected to a device that monitors your heart rate and rhythm. You will lie down on a table for an ultrasound exam. A gel will be applied to your chest to help sound waves pass through your skin. A handheld device, called a transducer, will be pressed against your chest and moved over your heart. The transducer produces sound waves that travel to your heart and bounce back (or "echo" back) to the transducer. These sound waves will be captured in real-time and changed into images of your heart that can be viewed on a video monitor. The images will be recorded on a computer and reviewed by your health care provider. You may be asked to change positions or hold your breath for a short time. This makes it easier to get different views or better views of your heart. In some cases, you may receive contrast through an IV in one of your veins. This can improve the quality of the pictures from your heart. The procedure may vary among health care providers and hospitals.   What can I expect after the test? You may return to your normal, everyday life, including diet, activities, and medicines, unless your health care provider tells you not to do that. Follow these instructions at home: It is up to you to get the results of your test. Ask your health care provider, or the department that is doing the test, when your results will be ready. Keep all follow-up visits. This is important. Summary An echocardiogram is a test that uses sound waves (ultrasound) to produce images of the heart. Images from an echocardiogram can provide important information about the size and shape of your heart, heart  muscle function, heart valve function, and other possible heart problems. You do not need to do anything to prepare before this test. You may eat and drink normally. After the echocardiogram is completed, you may return to your normal, everyday life, unless your health care provider tells you not to do that. This information is not intended to replace advice given to you by your health care provider. Make sure you discuss any questions you have with your health care provider. Document Revised: 03/09/2020 Document Reviewed: 03/09/2020 Elsevier Patient Education  [place]

## 2022-11-02 LAB — COMPREHENSIVE METABOLIC PANEL
ALT: 31 IU/L (ref 0–32)
AST: 38 IU/L (ref 0–40)
Albumin/Globulin Ratio: 1.6 (ref 1.2–2.2)
Albumin: 4.2 g/dL (ref 3.8–4.8)
Alkaline Phosphatase: 139 IU/L — ABNORMAL HIGH (ref 44–121)
BUN/Creatinine Ratio: 19 (ref 12–28)
BUN: 21 mg/dL (ref 8–27)
Bilirubin Total: 0.3 mg/dL (ref 0.0–1.2)
CO2: 19 mmol/L — ABNORMAL LOW (ref 20–29)
Calcium: 9.6 mg/dL (ref 8.7–10.3)
Chloride: 103 mmol/L (ref 96–106)
Creatinine, Ser: 1.11 mg/dL — ABNORMAL HIGH (ref 0.57–1.00)
Globulin, Total: 2.6 g/dL (ref 1.5–4.5)
Glucose: 248 mg/dL — ABNORMAL HIGH (ref 70–99)
Potassium: 4.3 mmol/L (ref 3.5–5.2)
Sodium: 141 mmol/L (ref 134–144)
Total Protein: 6.8 g/dL (ref 6.0–8.5)
eGFR: 52 mL/min/{1.73_m2} — ABNORMAL LOW (ref >59)

## 2022-11-02 LAB — CBC
Hematocrit: 42.7 % (ref 34.0–46.6)
Hemoglobin: 14.4 g/dL (ref 11.1–15.9)
MCH: 31.1 pg (ref 26.6–33.0)
MCHC: 33.7 g/dL (ref 31.5–35.7)
MCV: 92 fL (ref 79–97)
Platelets: 171 10*3/uL (ref 150–450)
RBC: 4.63 x10E6/uL (ref 3.77–5.28)
RDW: 12.3 % (ref 11.7–15.4)
WBC: 6.8 10*3/uL (ref 3.4–10.8)

## 2022-11-02 LAB — TSH: TSH: 1.76 u[IU]/mL (ref 0.450–4.500)

## 2022-11-14 ENCOUNTER — Ambulatory Visit: Payer: Medicare Other | Attending: Cardiology

## 2022-11-14 DIAGNOSIS — R011 Cardiac murmur, unspecified: Secondary | ICD-10-CM

## 2022-11-14 LAB — ECHOCARDIOGRAM COMPLETE
Area-P 1/2: 3.6 cm2
S' Lateral: 2.8 cm

## 2022-11-22 ENCOUNTER — Ambulatory Visit: Payer: Medicare Other | Admitting: Cardiology

## 2024-06-11 ENCOUNTER — Ambulatory Visit: Attending: Cardiology | Admitting: Cardiology

## 2024-06-11 ENCOUNTER — Encounter: Payer: Self-pay | Admitting: Cardiology

## 2024-06-11 VITALS — BP 120/60 | HR 60 | Ht 61.0 in | Wt 171.6 lb

## 2024-06-11 DIAGNOSIS — E088 Diabetes mellitus due to underlying condition with unspecified complications: Secondary | ICD-10-CM

## 2024-06-11 DIAGNOSIS — I10 Essential (primary) hypertension: Secondary | ICD-10-CM | POA: Diagnosis not present

## 2024-06-11 DIAGNOSIS — E782 Mixed hyperlipidemia: Secondary | ICD-10-CM | POA: Diagnosis not present

## 2024-06-11 NOTE — Progress Notes (Signed)
 Cardiology Office Note:    Date:  06/11/2024   ID:  Kayla Horne, DOB [birth year], MRN [id number]  PCP:  Kayla Cathlyn LABOR., MD  Cardiologist:  Kayla JONELLE Crape, MD   Referring MD: Kayla Cathlyn LABOR., MD    ASSESSMENT:    1. Mixed hyperlipidemia   2. Essential hypertension   3. Diabetes mellitus due to underlying condition with unspecified complications (HCC)    PLAN:    In order of problems listed above:  Primary prevention stressed with the patient.  Importance of compliance with diet medication stressed and patient verbalized standing. She was advised to ambulate to the best of her ability.   Cigarette smoker: I spent 5 minutes with the patient discussing solely about smoking. Smoking cessation was counseled. I suggested to the patient also different medications and pharmacological interventions. Patient is keen to try stopping on its own at this time. He will get back to me if he needs any further assistance in this matter. Essential hypertension: Blood pressure is stable and diet was emphasized. Mixed dyslipidemia: On lipid-lowering medications followed by primary care.  Goal LDL less than 70. Diabetes mellitus and obesity: Weight reduction stressed diet emphasized and she promises to do better.  Lab work from [facility] reveals levated hemoglobin A1c and she was cautioned about this. Patient will be seen in follow-up appointment in 6 months or earlier if the patient has any concerns.    Medication Adjustments/Labs and Tests Ordered: Current medicines are reviewed at length with the patient today.  Concerns regarding medicines are outlined above.  Orders Placed This Encounter  Procedures   EKG 12-Lead   No orders of the defined types were placed in this encounter.    No chief complaint on file.    History of Present Illness:    Kayla Horne is a 79 y.o. female.  Patient has past medical history of essential hypertension, mixed dyslipidemia, diabetes mellitus  and obesity.  She denies any problems at this time and takes care of activities of daily living.  No chest pain orthopnea or PND.  At the time of my evaluation, the patient is alert awake oriented and in no distress.  Past Medical History:  Diagnosis Date   Abnormal gait 09/16/2015   Last Assessment & Plan:  She is better from this with her gait and encourage her to continue with this  Last Assessment & Plan:  Formatting of this note might be different from the original. She is better from this with her gait and encourage her to continue with this   Acquired hypothyroidism 09/16/2015   Last Assessment & Plan:  This is stable for her and will see her level  Last Assessment & Plan:  Formatting of this note might be different from the original. This is stable for her and will see her level   Anxiety    Anxiety disorder 09/16/2015   Last Assessment & Plan:  Long discussion with her about her mood and would not change her med from this and she is depressed and anxious and crying which she refuses to have psych see her and her son is very supportive of her  Last Assessment & Plan:  Formatting of this note might be different from the original. Long discussion with her about her mood and would not change her med from this and she   Arthritis 09/16/2015   Last Assessment & Plan:  Relevant Hx: Course: Daily Update: Today's Plan:she has had multiple orthopedic procedures done  to her back and her knees with continued pain and disability for the same following with ortho still  Electronically signed by: Eleanor Merlynn Lady, NP 09/17/15 2158   BMI 34.0-34.9,adult 06/14/2020   Bradycardia 11/14/2018   [place]   Callus of foot 11/13/2016   Chronic bronchitis (HCC) 05/03/2018   No formal diagnosis of COPD  Formatting of this note might be different from the original. No formal diagnosis of COPD   Chronic toe pain, bilateral 10/04/2022   Cigarette smoker 02/09/2022   Class 2 severe obesity due to excess  calories with serious comorbidity and body mass index (BMI) of 36.0 to 36.9 in adult 09/16/2015   Complication of anesthesia    nausea post op   Contracture of both Achilles tendons 10/29/2017   Cough 02/11/2008   Qualifier: Diagnosis of  By: Neysa MD, Clinton D    Degeneration of lumbar intervertebral disc 10/12/2008   Qualifier: Diagnosis of  By: Neysa MD, Clinton D   Last Assessment & Plan:  Formatting of this note might be different from the original. This is as stable as she can be and she did have surgery still following with Dr. Gust for this   Depression    Diabetes mellitus due to underlying condition with unspecified complications (HCC) 12/25/2018   DIABETES, TYPE 2 02/16/2008   Qualifier: Diagnosis of  By: Neysa MD, Clinton D    Diabetic peripheral neuropathy (HCC) 09/27/2020   Diverticulosis 09/20/2021   Dysphagia 11/14/2018   [place]   Esophageal reflux 02/11/2008   Centricity Description: GASTROESOPHAGEAL REFLUX DISEASE, CHRONIC Qualifier: Diagnosis of  By: Neysa MD, Reggy BIRCH  Centricity Description: KANDICE FORBES SAUNDERS D Qualifier: Diagnosis of  By: Neysa MD, [email or web address].emerald D    Essential hypertension 09/16/2015   Last Assessment & Plan:  This is stable for her and will follow  Last Assessment & Plan:  Formatting of this note might be different from the original. This is stable for her and will follow   Essential tremor 09/16/2015   Last Assessment & Plan:  Relevant Hx: Course: Daily Update: Today's Plan:they think this is better though still present on exam today and on beta blocker and has seen neuro recently  Electronically signed by: Eleanor Merlynn Lady, NP 09/17/15 2156  Last Assessment & Plan:  Formatting of this note might be different from the original. Relevant Hx: Course: Daily Update: Today's Plan:they think   Fibromyalgia 09/16/2015   Last Assessment & Plan:  This is off and on for her but she feels since having her back surgery it is better for her  Last Assessment & Plan:   Relevant Hx: Course: Daily Update: Today's Plan:tied to her severe DDD which she states she is planning to have back surgery for on 10/08/15  Electronically signed by: Eleanor Merlynn Lady, NP 09/17/15 2157  Last Assessment & Plan:  Formatting of this    Forgetfulness 12/01/2019   Gastroesophageal reflux disease without esophagitis 02/11/2008   Centricity Description: GASTROESOPHAGEAL REFLUX DISEASE, CHRONIC Qualifier: Diagnosis of  By: Neysa MD, Reggy BIRCH  Centricity Description: KANDICE FORBES SAUNDERS D Qualifier: Diagnosis of  By: Neysa MD, Clinton D   Last Assessment & Plan:  Formatting of this note might be different from the original. This is stable for her overall   Hallux valgus 09/16/2015   HEMOPTYSIS 10/12/2008   Qualifier: Diagnosis of   By: Neysa MD, Clinton D     Replacing diagnoses that were inactivated after the 10/30/22 regulatory import   High  risk medication use 09/16/2015   History of ischemic vertebrobasilar artery thalamic stroke 09/17/2015   Last Assessment & Plan:  Relevant Hx: Course: Daily Update: Today's Plan:seen on MRI brain 2017 started on statin advised her to follow up on her sugars as well  Electronically signed by: Eleanor Merlynn Lady, NP 09/17/15 2210  Last Assessment & Plan:  Formatting of this note might be different from the original. Relevant Hx: Course: Daily Update: Today's Plan:seen on MRI brain 2017 started    History of ventral hernia repair 11/20/2019   Hyperlipidemia 09/16/2015   Last Assessment & Plan:  She is going to have lipids checked today  Last Assessment & Plan:  Formatting of this note might be different from the original. She is going to have lipids checked today   Hypertension    Hypothyroidism    Laceration of scalp 09/06/2016   Lichen sclerosus 05/20/2021   Liver cirrhosis (HCC) 09/20/2021   Localized edema 03/18/2018   Formatting of this note might be different from the original. Secondary to amlodipine .   Malaise and fatigue 09/10/2019    Metatarsalgia of both feet 10/29/2017   Mixed stress and urge urinary incontinence 09/03/2017   Myalgia 09/16/2015   Nail dystrophy 09/27/2020   Neck mass 09/06/2016   Obesity 09/16/2015   Obesity (BMI 30.0-34.9) 02/07/2022   Onychomycosis due to dermatophyte 11/13/2016   Osteoarthritis of left knee 09/10/2019   Pain in left knee 04/11/2018   Palpitations 11/01/2022   Panniculitis 12/05/2019   Parkinsonism (HCC) 09/09/2015   Polyneuropathy 09/16/2015   Last Assessment & Plan:  She is having stablity of this she states  Last Assessment & Plan:  Formatting of this note might be different from the original. She is having stablity of this she states   Pre-ulcerative calluses 09/27/2020   Preoperative cardiovascular examination 12/10/2020   Primary osteoarthritis of right shoulder 09/05/2018   Recurrent major depressive disorder 09/16/2015   Last Assessment & Plan:  She is to continue with her current meds and discussed with her and her son she has to try and get out more and be active and not sit at home dwelling on things she cannot change with her health  Last Assessment & Plan:  Formatting of this note might be different from the original. She is to continue with her current meds and discussed with her and her son she has to try a   Recurrent UTI 01/14/2019   Renal cell carcinoma of right kidney (HCC) 04/20/2022   Formatting of this note might be different from the original. 1.4 cm on CT [facility] 03/2022.  Tells me she saw urology and observing.  Keep f/u with them. Formatting of this note might be different from the original. 1.4 cm on CT [facility] 03/2022.  Tells me she saw urology and observing.  Keep f/u with them.   Renal cyst 09/20/2021   Restless leg syndrome 09/09/2015   RLS (restless legs syndrome) 09/09/2015   Last Assessment & Plan:  Formatting of this note might be different from the original. Relevant Hx: Course: Daily Update: Today's Plan:she feels this is more stable for her today   Electronically signed by: Eleanor Merlynn Lady, NP 09/17/15 2204   S/P TKR (total knee replacement) using cement, right 06/16/2019   Stage 3a chronic kidney disease (HCC) 06/27/2018   Tremor 09/09/2015   Type 2 diabetes mellitus with stage 3 chronic kidney disease, with long-term current use of insulin  (HCC) 09/17/2015   Last Assessment & Plan:  Formatting of this note might be different from the original. She has been all over the place and she admits she is not eating what she should and had a reading of 500 and low has not been more than 200 for her, and her sensation is better she had admitted she has to take this more seriously   Vitamin B12 deficiency 03/01/2017   Vitamin D deficiency 03/01/2017    Past Surgical History:  Procedure Laterality Date   ABDOMINAL HYSTERECTOMY     BREAST REDUCTION SURGERY     CERVICAL SPINE SURGERY     CHOLECYSTECTOMY     HAND SURGERY     KNEE SURGERY     Arthroscopic right   LUMBAR DISC SURGERY  2017   x 2   TOTAL KNEE ARTHROPLASTY Right 06/16/2019   Procedure: TOTAL KNEE ARTHROPLASTY;  Surgeon: Rubie Kemps, MD;  Location: [facility];  Service: Orthopedics;  Laterality: Right;  75 mins needed for length of case    Current Medications: Current Meds  Medication Sig   acetaminophen  (TYLENOL ) 650 MG CR tablet Take 650 mg by mouth every 8 (eight) hours as needed for pain.   amLODipine  (NORVASC ) 10 MG tablet Take 5 mg by mouth every evening.   citalopram  (CELEXA ) 20 MG tablet Take 20 mg by mouth daily.   clonazePAM (KLONOPIN) 1 MG tablet Take 1 mg by mouth every evening.   cyanocobalamin (,VITAMIN B-12,) 1000 MCG/ML injection Inject 1,000 mcg into the muscle every 30 (thirty) days.   furosemide  (LASIX ) 20 MG tablet Take 40 mg by mouth daily.   gabapentin  (NEURONTIN ) 100 MG capsule Take 100 mg by mouth at bedtime.   insulin  NPH-regular Human (NOVOLIN 70/30) (70-30) 100 UNIT/ML injection Inject 35 Units into the skin 2 (two) times daily with a meal.     levothyroxine  (SYNTHROID ) 100 MCG tablet Take 100 mcg by mouth daily.    losartan  (COZAAR ) 100 MG tablet Take 100 mg by mouth daily.   Menthol -Camphor (ICY HOT ARTHRITIS PAIN RELIEF EX) Apply 1 application topically daily as needed (pain).   nitroGLYCERIN  (NITROSTAT ) 0.4 MG SL tablet Place 0.4 mg under the tongue every 5 (five) minutes as needed for chest pain.   ondansetron  (ZOFRAN ) 4 MG tablet Take 4 mg by mouth every 8 (eight) hours as needed for nausea/vomiting.   oxybutynin  (DITROPAN ) 5 MG tablet Take 5 mg by mouth 2 (two) times daily.   pantoprazole  (PROTONIX ) 40 MG tablet Take 40 mg by mouth daily.   pravastatin  (PRAVACHOL ) 20 MG tablet Take 20 mg by mouth daily.   Vitamin D, Ergocalciferol, (DRISDOL) 1.25 MG (50000 UNIT) CAPS capsule Take 50,000 Units by mouth once a week.   [DISCONTINUED] BREO ELLIPTA 100-25 MCG/ACT AEPB Inhale 1 puff into the lungs daily.     Allergies:   Codeine sulfate, Morphine, Latex, and Tape   Social History   Socioeconomic History   Marital status: Single    Spouse name: Not on file   Number of children: 1   Years of education: 12   Highest education level: Not on file  Occupational History   Occupation: retired  Tobacco Use   Smoking status: Some Days    Current packs/day: 0.50    Average packs/day: 0.5 packs/day for 20.0 years (10.0 ttl pk-yrs)    Types: Cigarettes   Smokeless tobacco: Never   Tobacco comments:    5 -7 cigarettes a week  Substance and Sexual Activity   Alcohol use: No   Drug use:  No   Sexual activity: Not on file  Other Topics Concern   Not on file  Social History Narrative   Lives with two dogs and two cats.  One adopted son.        Patient does not drink caffeine.   Patient is right handed.    Social Drivers of Corporate Investment Banker Strain: Not on file  Food Insecurity: Low Risk  (01/24/2024)   Received from [facility]   Hunger Vital Sign    Within the past 12 months, you worried that your food would  run out before you got money to buy more: Never true    Within the past 12 months, the food you bought just didn't last and you didn't have money to get more. : Never true  Transportation Needs: No Transportation Needs (01/24/2024)   Received from [facility]    In the past 12 months, has lack of reliable transportation kept you from medical appointments, meetings, work or from getting things needed for daily living? : No  Physical Activity: Not on file  Stress: Not on file  Social Connections: Not on file     Family History: The patient's family history includes CAD in her father; CAD (age of onset: 50) in her brother; CAD (age of onset: 68) in her brother; Restless legs syndrome in her mother; Stroke (age of onset: 2) in her father. There is no history of Tremor.  ROS:   Please see the history of present illness.    All other systems reviewed and are negative.  EKGs/Labs/Other Studies Reviewed:    The following studies were reviewed today: .SABRA   I discussed my findings with the patient at length   Recent Labs: No results found for requested labs within last 365 days.  Recent Lipid Panel No results found for: CHOL, TRIG, HDL, CHOLHDL, VLDL, LDLCALC, LDLDIRECT  Physical Exam:    VS:  There were no vitals taken for this visit.    Wt Readings from Last 3 Encounters:  11/01/22 176 lb (79.8 kg)  02/07/22 173 lb (78.5 kg)  12/10/20 184 lb 6.4 oz (83.6 kg)     GEN: Patient is in no acute distress HEENT: Normal NECK: No JVD; No carotid bruits LYMPHATICS: No lymphadenopathy CARDIAC: Hear sounds regular, 2/6 systolic murmur at the apex. RESPIRATORY:  Clear to auscultation without rales, wheezing or rhonchi  ABDOMEN: Soft, non-tender, non-distended MUSCULOSKELETAL:  No edema; No deformity  SKIN: Warm and dry NEUROLOGIC:  Alert and oriented x 3 PSYCHIATRIC:  Normal affect   Signed, Kayla JONELLE Crape, MD  06/11/2024 4:26 PM    [facility]

## 2024-06-11 NOTE — Patient Instructions (Signed)
